# Patient Record
Sex: Female | Born: 1970 | Hispanic: No | Marital: Married | Smoking: Never smoker
Health system: Southern US, Community
[De-identification: ages and names within clinical notes are randomized; demographics above are authoritative.]

## PROBLEM LIST (undated history)

## (undated) DIAGNOSIS — N3946 Mixed incontinence: Secondary | ICD-10-CM

## (undated) DIAGNOSIS — E785 Hyperlipidemia, unspecified: Secondary | ICD-10-CM

## (undated) DIAGNOSIS — E559 Vitamin D deficiency, unspecified: Secondary | ICD-10-CM

## (undated) DIAGNOSIS — R739 Hyperglycemia, unspecified: Secondary | ICD-10-CM

## (undated) DIAGNOSIS — K589 Irritable bowel syndrome without diarrhea: Secondary | ICD-10-CM

## (undated) DIAGNOSIS — K219 Gastro-esophageal reflux disease without esophagitis: Secondary | ICD-10-CM

## (undated) DIAGNOSIS — G43909 Migraine, unspecified, not intractable, without status migrainosus: Secondary | ICD-10-CM

## (undated) HISTORY — DX: Irritable bowel syndrome, unspecified: K58.9

## (undated) HISTORY — DX: Gastro-esophageal reflux disease without esophagitis: K21.9

## (undated) HISTORY — DX: Mixed incontinence: N39.46

## (undated) HISTORY — DX: Migraine, unspecified, not intractable, without status migrainosus: G43.909

## (undated) HISTORY — DX: Vitamin D deficiency, unspecified: E55.9

## (undated) HISTORY — PX: APPENDECTOMY: SHX54

## (undated) HISTORY — DX: Hyperglycemia, unspecified: R73.9

## (undated) HISTORY — DX: Hyperlipidemia, unspecified: E78.5

---
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
## 2007-09-14 ENCOUNTER — Other Ambulatory Visit: Admission: RE | Admit: 2007-09-14 | Discharge: 2007-09-14 | Payer: Self-pay | Admitting: Gynecology

## 2008-07-18 ENCOUNTER — Ambulatory Visit: Payer: Self-pay | Admitting: Gynecology

## 2008-09-15 ENCOUNTER — Encounter: Payer: Self-pay | Admitting: Gynecology

## 2008-09-15 ENCOUNTER — Other Ambulatory Visit: Admission: RE | Admit: 2008-09-15 | Discharge: 2008-09-15 | Payer: Self-pay | Admitting: Gynecology

## 2008-09-15 ENCOUNTER — Ambulatory Visit: Payer: Self-pay | Admitting: Gynecology

## 2008-09-26 ENCOUNTER — Ambulatory Visit: Payer: Self-pay | Admitting: Gynecology

## 2009-01-02 ENCOUNTER — Ambulatory Visit: Payer: Self-pay | Admitting: Family Medicine

## 2009-01-02 DIAGNOSIS — L049 Acute lymphadenitis, unspecified: Secondary | ICD-10-CM | POA: Insufficient documentation

## 2009-01-03 ENCOUNTER — Encounter (INDEPENDENT_AMBULATORY_CARE_PROVIDER_SITE_OTHER): Payer: Self-pay | Admitting: *Deleted

## 2009-01-03 LAB — CONVERTED CEMR LAB
ALT: 16 units/L (ref 0–35)
AST: 15 units/L (ref 0–37)
Albumin: 3.7 g/dL (ref 3.5–5.2)
Alkaline Phosphatase: 66 units/L (ref 39–117)
Basophils Relative: 0 % (ref 0.0–3.0)
CO2: 29 meq/L (ref 19–32)
Calcium: 9.1 mg/dL (ref 8.4–10.5)
Eosinophils Absolute: 0.1 10*3/uL (ref 0.0–0.7)
Eosinophils Relative: 1.5 % (ref 0.0–5.0)
HDL: 39.1 mg/dL (ref >39.00)
Hemoglobin: 14 g/dL (ref 12.0–15.0)
Lymphocytes Relative: 35.5 % (ref 12.0–46.0)
MCHC: 34.2 g/dL (ref 30.0–36.0)
Monocytes Relative: 9.4 % (ref 3.0–12.0)
Neutro Abs: 3.4 10*3/uL (ref 1.4–7.7)
RBC: 4.36 M/uL (ref 3.87–5.11)
Sodium: 142 meq/L (ref 135–145)
Total CHOL/HDL Ratio: 4
Total Protein: 7 g/dL (ref 6.0–8.3)
WBC: 6.4 10*3/uL (ref 4.5–10.5)

## 2009-01-16 ENCOUNTER — Ambulatory Visit: Payer: Self-pay | Admitting: Family Medicine

## 2009-01-16 DIAGNOSIS — J309 Allergic rhinitis, unspecified: Secondary | ICD-10-CM | POA: Insufficient documentation

## 2009-01-19 ENCOUNTER — Encounter: Payer: Self-pay | Admitting: Family Medicine

## 2009-10-19 ENCOUNTER — Ambulatory Visit: Payer: Self-pay | Admitting: Family Medicine

## 2009-10-19 DIAGNOSIS — R1013 Epigastric pain: Secondary | ICD-10-CM

## 2009-10-19 DIAGNOSIS — K3189 Other diseases of stomach and duodenum: Secondary | ICD-10-CM | POA: Insufficient documentation

## 2009-10-19 DIAGNOSIS — K649 Unspecified hemorrhoids: Secondary | ICD-10-CM | POA: Insufficient documentation

## 2009-10-19 DIAGNOSIS — R109 Unspecified abdominal pain: Secondary | ICD-10-CM | POA: Insufficient documentation

## 2009-10-20 ENCOUNTER — Telehealth: Payer: Self-pay | Admitting: Family Medicine

## 2009-10-20 LAB — CONVERTED CEMR LAB
ALT: 28 units/L (ref 0–35)
AST: 19 units/L (ref 0–37)
BUN: 10 mg/dL (ref 6–23)
Basophils Absolute: 0 10*3/uL (ref 0.0–0.1)
Bilirubin, Direct: 0.1 mg/dL (ref 0.0–0.3)
Calcium: 9.1 mg/dL (ref 8.4–10.5)
Creatinine, Ser: 0.7 mg/dL (ref 0.4–1.2)
Eosinophils Relative: 1.4 % (ref 0.0–5.0)
GFR calc non Af Amer: 99.44 mL/min (ref >60)
Glucose, Bld: 109 mg/dL — ABNORMAL HIGH (ref 70–99)
H Pylori IgG: NEGATIVE
Monocytes Relative: 7.1 % (ref 3.0–12.0)
Neutrophils Relative %: 60.7 % (ref 43.0–77.0)
Platelets: 181 10*3/uL (ref 150.0–400.0)
RDW: 12.1 % (ref 11.5–14.6)
Total Bilirubin: 0.6 mg/dL (ref 0.3–1.2)
WBC: 5.4 10*3/uL (ref 4.5–10.5)

## 2009-11-06 ENCOUNTER — Ambulatory Visit: Payer: Self-pay | Admitting: Family

## 2009-11-06 ENCOUNTER — Telehealth: Payer: Self-pay | Admitting: Family

## 2009-11-06 DIAGNOSIS — Z9189 Other specified personal risk factors, not elsewhere classified: Secondary | ICD-10-CM | POA: Insufficient documentation

## 2009-11-07 ENCOUNTER — Encounter: Payer: Self-pay | Admitting: Family

## 2009-11-08 ENCOUNTER — Telehealth: Payer: Self-pay | Admitting: Family

## 2009-11-17 ENCOUNTER — Ambulatory Visit: Payer: Self-pay | Admitting: Family

## 2009-11-20 ENCOUNTER — Ambulatory Visit: Payer: Self-pay | Admitting: Gynecology

## 2009-11-20 ENCOUNTER — Other Ambulatory Visit: Admission: RE | Admit: 2009-11-20 | Discharge: 2009-11-20 | Payer: Self-pay | Admitting: Gynecology

## 2009-11-23 ENCOUNTER — Encounter: Payer: Self-pay | Admitting: Family

## 2009-12-26 ENCOUNTER — Encounter: Payer: Self-pay | Admitting: Internal Medicine

## 2010-02-06 ENCOUNTER — Encounter: Payer: Self-pay | Admitting: Family

## 2010-02-07 ENCOUNTER — Encounter: Payer: Self-pay | Admitting: Family

## 2010-05-15 ENCOUNTER — Ambulatory Visit: Payer: Self-pay | Admitting: Family Medicine

## 2010-05-15 DIAGNOSIS — R079 Chest pain, unspecified: Secondary | ICD-10-CM | POA: Insufficient documentation

## 2010-05-16 LAB — CONVERTED CEMR LAB
ALT: 16 units/L (ref 0–35)
BUN: 10 mg/dL (ref 6–23)
Basophils Relative: 0.4 % (ref 0.0–3.0)
Bilirubin, Direct: 0.1 mg/dL (ref 0.0–0.3)
Chloride: 109 meq/L (ref 96–112)
Cholesterol: 157 mg/dL (ref 0–200)
Eosinophils Relative: 1.3 % (ref 0.0–5.0)
GFR calc non Af Amer: 109.95 mL/min (ref >60)
HCT: 40.9 % (ref 36.0–46.0)
HDL: 34.4 mg/dL — ABNORMAL LOW (ref >39.00)
Hemoglobin: 14 g/dL (ref 12.0–15.0)
LDL Cholesterol: 99 mg/dL (ref 0–99)
Lymphs Abs: 1.8 10*3/uL (ref 0.7–4.0)
MCV: 95.9 fL (ref 78.0–100.0)
Monocytes Absolute: 0.4 10*3/uL (ref 0.1–1.0)
Monocytes Relative: 6.2 % (ref 3.0–12.0)
Neutro Abs: 4 10*3/uL (ref 1.4–7.7)
Potassium: 4 meq/L (ref 3.5–5.1)
RBC: 4.27 M/uL (ref 3.87–5.11)
Sodium: 142 meq/L (ref 135–145)
TSH: 0.43 microintl units/mL (ref 0.35–5.50)
Total Protein: 6.6 g/dL (ref 6.0–8.3)
VLDL: 23.2 mg/dL (ref 0.0–40.0)
WBC: 6.4 10*3/uL (ref 4.5–10.5)

## 2010-09-13 ENCOUNTER — Ambulatory Visit
Admission: RE | Admit: 2010-09-13 | Discharge: 2010-09-13 | Payer: Self-pay | Source: Home / Self Care | Attending: Family Medicine | Admitting: Family Medicine

## 2010-09-13 DIAGNOSIS — H6980 Other specified disorders of Eustachian tube, unspecified ear: Secondary | ICD-10-CM | POA: Insufficient documentation

## 2010-09-13 DIAGNOSIS — L218 Other seborrheic dermatitis: Secondary | ICD-10-CM | POA: Insufficient documentation

## 2010-10-02 NOTE — Progress Notes (Signed)
Summary: refill  Phone Note Refill Request Message from:  Fax from Pharmacy on October 20, 2009 10:27 AM  Refills Requested: Medication #1:  PROCTOSOL HC 2.5 % CREA apply two times a day - qid. can we use proctosal dc 2.5  % It is generic, but not a-b rated equivalent. Thanks   Method Requested: Fax to Local Pharmacy Next Appointment Scheduled: no appt Initial call taken by: Barb Merino,  October 20, 2009 10:29 AM  Follow-up for Phone Call        thats fine Follow-up by: Loreen Freud DO,  October 20, 2009 11:26 AM  Additional Follow-up for Phone Call Additional follow up Details #1::        phoned into pharm. Army Fossa CMA  October 20, 2009 11:42 AM     New/Updated Medications: * PROCTOSOL DC 2.5 % CREA (HYDROCORTISONE) apply two times a day - qid

## 2010-10-02 NOTE — Consult Note (Signed)
Summary: [facility]  [facility]   Imported By: Lanelle Bal 02/19/2010 13:24:28  _____________________________________________________________________  External Attachment:    Type:   Image     Comment:   External Document

## 2010-10-02 NOTE — Assessment & Plan Note (Signed)
Summary: FOR STOMACH PAIN//PH   Vital Signs:  Patient profile:   40 year old female Weight:      180 pounds BMI:     36.49 Temp:     98.6 degrees F oral Pulse rate:   72 / minute Pulse rhythm:   regular BP sitting:   122 / 80  (left arm) Cuff size:   regular  Vitals Entered By: Army Fossa CMA (October 19, 2009 1:56 PM) CC: Pt c/o upper stomach pain since sunday- no NVD. , Heartburn   History of Present Illness:  Heartburn      This is a 40 year old woman who presents with Heartburn.  The symptoms began 5 days ago.  The patient complains of sour taste in mouth and epigastric pain, but denies acid reflux, chest pain, trouble swallowing, weight loss, and weight gain.  The patient denies the following alarm features: melena, dysphagia, hematemesis, vomiting, involuntary weight loss >5%, and history of anemia.  Symptoms are worse with spicy foods and citrus.  Treatment that was tried and either found to be ineffective or stopped due to problems include an antacid.    Current Medications (verified): 1)  Fluticasone Propionate 50 Mcg/act  Susp (Fluticasone Propionate) .... 2 Sprays Each Nostril Once Daily 2)  Protonix 40 Mg Tbec (Pantoprazole Sodium) .Marland Kitchen.. 1 By Mouth Once Daily 3)  Proctosol Hc 2.5 % Crea (Hydrocortisone) .... Apply Two Times A Day - Qid  Allergies (verified): No Known Drug Allergies  Past History:  Past medical, surgical, family and social histories (including risk factors) reviewed for relevance to current acute and chronic problems.  Past Medical History: Reviewed history from 01/02/2009 and no changes required. none  Past Surgical History: Reviewed history from 01/02/2009 and no changes required. Appendectomy  Family History: Reviewed history from 01/02/2009 and no changes required. CAD-mother HTN-mother DM-mother STOKE-no COLON CA-no BREAST CA-no  Social History: Reviewed history from 01/02/2009 and no changes required. married, 2 children-  boys (99, 07) works at [employer]  Review of Systems      See HPI  Physical Exam  General:  Well-developed,well-nourished,in no acute distress; alert,appropriate and cooperative throughout examination Neck:  No deformities, masses, or tenderness noted. Lungs:  Normal respiratory effort, chest expands symmetrically. Lungs are clear to auscultation, no crackles or wheezes. Heart:  Normal rate and regular rhythm. S1 and S2 normal without gallop, murmur, click, rub or other extra sounds. Abdomen:  soft, normal bowel sounds, no distention, no masses, no guarding, no rigidity, and epigastric tenderness.   Skin:  Intact without suspicious lesions or rashes Psych:  Oriented X3 and normally interactive.     Impression & Recommendations:  Problem # 1:  DYSPEPSIA (ICD-536.8) protonix once daily check labs  GI if no better Orders: Venipuncture ([address]109) TLB-BMP (Basic Metabolic Panel-BMET) (80048-METABOL) TLB-CBC Platelet - w/Differential (85025-CBCD) TLB-Hepatic/Liver Function Pnl (80076-HEPATIC) TLB-H. Pylori Abs(Helicobacter Pylori) (86677-HELICO)  Problem # 2:  EXTERNAL HEMORRHOIDS WITHOUT MENTION COMP (ICD-455.3)  Complete Medication List: 1)  Fluticasone Propionate 50 Mcg/act Susp (Fluticasone propionate) .... 2 sprays each nostril once daily 2)  Protonix 40 Mg Tbec (Pantoprazole sodium) .Marland Kitchen.. 1 by mouth once daily 3)  Proctosol Hc 2.5 % Crea (Hydrocortisone) .... Apply two times a day - qid Prescriptions: PROCTOSOL HC 2.5 % CREA (HYDROCORTISONE) apply two times a day - qid  #1 tube x 1   Entered and Authorized by:   Loreen Freud DO   Signed by:   Loreen Freud DO on 10/19/2009  Method used:   Electronically to        [facility] # [phone]* (retail)       [address]       [place], [place]  [address]       Ph: [phone]       Fax: [phone]   RxID:   [id number] PROTONIX 40 MG TBEC (PANTOPRAZOLE SODIUM) 1 by mouth once daily  #30 x  11   Entered and Authorized by:   Loreen Freud DO   Signed by:   Loreen Freud DO on 10/19/2009   Method used:   Electronically to        [facility] # [phone]* (retail)       [address]       [place], [place]  [address]       Ph: [phone]       Fax: [phone]   RxID:   [id number] DEXILANT 60 MG CPDR (DEXLANSOPRAZOLE) 1 by mouth once daily  #30 x 5   Entered and Authorized by:   Loreen Freud DO   Signed by:   Loreen Freud DO on 10/19/2009   Method used:   Print then Give to Patient   RxID:   [id number]

## 2010-10-02 NOTE — Progress Notes (Signed)
  Phone Note Outgoing Call   Call placed to: Insurer Summary of Call: Marchia Bond, spoke with MD approval obtained- # [id number]. Initial call taken by: Lemont Fillers FNP,  November 06, 2009 5:00 PM

## 2010-10-02 NOTE — Assessment & Plan Note (Signed)
Summary: hemorroids/kdc   Vital Signs:  Patient profile:   40 year old female Weight:      177.25 pounds BMI:     35.93 Temp:     98.0 degrees F oral Pulse rate:   92 / minute Pulse rhythm:   regular Resp:     16 per minute BP sitting:   120 / 88  (right arm) Cuff size:   regular  Vitals Entered By: Mervin Kung CMA (November 06, 2009 3:08 PM) CC: room 5  Bowel movements hard, hemorrhoid x 2 weeks.   Primary Care Provider:  Beverely Low  CC:  room 5  Bowel movements hard and hemorrhoid x 2 weeks.Marland Kitchen  History of Present Illness: Destiny Rios is a 40 yr old female who presents with complaint of hemorrhoids.  Notes that she been experiencing hard stools x 3 weeks.  She started miralax two weeks ago.  Now having 3-4 BM's a day but notes that she still has to strain with BM's.  Has associated rectal pain.  Notes that she is unable to have a BM lately unless she uses miralax.  Denies fever- notes some epigastric discomfort which is intermittent.  Notes that the abdominal discomfort is worse if she is not able to move her bowels.  Denies black stools (+ brown color).  Denies any bright red blood on tissue or in the toilet.    Allergies (verified): No Known Drug Allergies  Past History:  Past Medical History: Last updated: 01/02/2009 none  Past Surgical History: Last updated: 01/02/2009 Appendectomy  Family History: Last updated: 01/02/2009 CAD-mother HTN-mother DM-mother STOKE-no COLON CA-no BREAST CA-no  Social History: Last updated: 01/02/2009 married, 2 children- boys (99, 07) works at [employer]  Risk Factors: Alcohol Use: 0 (01/02/2009) Exercise: yes (01/02/2009)  Risk Factors: Smoking Status: never (01/02/2009)  Physical Exam  General:  Well-developed,well-nourished,in no acute distress; alert,appropriate and cooperative throughout examination Head:  Normocephalic and atraumatic without obvious abnormalities. No apparent alopecia or balding. Lungs:  Normal  respiratory effort, chest expands symmetrically. Lungs are clear to auscultation, no crackles or wheezes. Heart:  Normal rate and regular rhythm. S1 and S2 normal without gallop, murmur, click, rub or other extra sounds. Abdomen:  Soft, + bowel sounds.  No distension.  + LLQ tenderness to palpation without guarding.    Impression & Recommendations:  Problem # 1:  ABDOMINAL PAIN, LEFT LOWER QUADRANT, HX OF (ICD-V15.89) Patient has LLQ tenderness and 3 week history of constipation-  Will check a CT abd/pelvis to r/o diverticulitis/colitis.  Will also plan empiric treatment with cipro and flagyl.  Pt was instructed to use back up birth control.  25 minutes were spent, greater than 50% of this time was spent on coordination of care (calling insurer) and counselling regarding abdominal pain and red flags that should prompt return.   Orders: Misc. Referral (Misc. Ref)  Complete Medication List: 1)  Fluticasone Propionate 50 Mcg/act Susp (Fluticasone propionate) .... 2 sprays each nostril once daily 2)  Protonix 40 Mg Tbec (Pantoprazole sodium) .Marland Kitchen.. 1 by mouth once daily 3)  Proctosol Dc 2.5 % Crea (hydrocortisone)  .... Apply two times a day - qid 4)  Lutera 0.1-20 Mg-mcg Tabs (Levonorgestrel-ethinyl estrad) .... Take 1 tablet by mouth once a day 5)  Cipro 500 Mg Tabs (Ciprofloxacin hcl) .... One tablet by mouth two times a day x 10 days 6)  Metronidazole 500 Mg Tabs (Metronidazole) .... One tablet by mouth three times a day x 10 days  Patient Instructions: 1)  Please complete your CT- we will call you with the results. 2)  Call if fever over 101, or worsening abdominal pain. Prescriptions: METRONIDAZOLE 500 MG TABS (METRONIDAZOLE) one tablet by mouth three times a day x 10 days  #30 x 0   Entered and Authorized by:   Lemont Fillers FNP   Signed by:   Lemont Fillers FNP on 11/06/2009   Method used:   Print then Give to Patient   RxID:   [id number] CIPRO 500 MG TABS  (CIPROFLOXACIN HCL) one tablet by mouth two times a day x 10 days  #20 x 0   Entered and Authorized by:   Lemont Fillers FNP   Signed by:   Lemont Fillers FNP on 11/06/2009   Method used:   Print then Give to Patient   RxID:   [id number]   Current Allergies (reviewed today): No known allergies

## 2010-10-02 NOTE — Consult Note (Signed)
Summary: [facility]  [facility]   Imported By: Lanelle Bal 12/11/2009 12:27:32  _____________________________________________________________________  External Attachment:    Type:   Image     Comment:   External Document

## 2010-10-02 NOTE — Consult Note (Signed)
Summary: [facility]  [facility]   Imported By: Lanelle Bal 01/02/2010 13:37:52  _____________________________________________________________________  External Attachment:    Type:   Image     Comment:   External Document

## 2010-10-02 NOTE — Letter (Signed)
Summary: Letter to Patient Regarding Xray Results/[facility]  Letter to Patient Regarding Xray Results/[facility]   Imported By: Lanelle Bal 12/11/2009 12:28:46  _____________________________________________________________________  External Attachment:    Type:   Image     Comment:   External Document

## 2010-10-02 NOTE — Progress Notes (Signed)
  Phone Note Outgoing Call   Summary of Call: Pls call patient and let her know that her CT looks normal.  I would like for her to follow up in 1 week, sooner if symptoms worsen, or if fever over 101. Initial call taken by: Lemont Fillers FNP,  November 08, 2009 1:16 PM  Follow-up for Phone Call        I informed pt. that her CT was normal and informed her of Fabian Coca's instructions. Also made pt. appt. for a f/u with Aliya Sol 11/17/09 at 3:45 Follow-up by: Michaelle Copas,  November 08, 2009 4:02 PM

## 2010-10-02 NOTE — Assessment & Plan Note (Signed)
Summary: 1 week f/u - jr   Vital Signs:  Patient profile:   40 year old female Weight:      176.50 pounds BMI:     35.78 Temp:     97.7 degrees F oral Pulse rate:   80 / minute Pulse rhythm:   regular Resp:     12 per minute BP sitting:   116 / 80  (right arm) Cuff size:   large  Vitals Entered By: Destiny Rios CMA (November 17, 2009 4:04 PM) CC: room 5  1 week f/u   Primary Care Provider:  Beverely Low  CC:  room 5  1 week f/u.  History of Present Illness: Destiny Rios is a 40 year old female who presents today for follow up of her LLQ abdominal pain.  She had a CT of the abdomen and pelvis performed which was unremarkable and has completed a 10 day course of cipro/flagyl.  Now having 4 bowel movents/day, had diarrhea last thursday and friday and now having constipation which is aggravating her hemorrhoids.  Notes that she saw one drop of blood in toilet last week due to hemorrhoids.  Bowel movements are greenish in color, denies melena.  Denies vomitting but has has had some nausea which she attributes to abx (flagyl).  Felt feverish last Saturday, but wasn't able to take her temperature.    Allergies (verified): No Known Drug Allergies  Physical Exam  General:  Well-developed,well-nourished,in no acute distress; alert,appropriate and cooperative throughout examination Head:  Normocephalic and atraumatic without obvious abnormalities. No apparent alopecia or balding. Abdomen:  soft, + bowel sounds.  Non-distended.  mild tenderness to palpation in right lower quadrant and left lower quadrant without guarding.  + RLQ scarring from former appendectomy Rectal:  No external hemorrhoids, + palpable internal hemorrhoids.   Impression & Recommendations:  Problem # 1:  ABDOMINAL PAIN, LEFT LOWER QUADRANT, HX OF (ICD-V15.89) Assessment Unchanged Will plan referral to GI at this point.  Patient may benefit from colonoscopy- will defer to GI.  Pt instructed per below  instructions. Orders: Gastroenterology Referral (GI)  Problem # 2:  HEMORRHOIDS (ICD-455.6) Continue proctosol.    Complete Medication List: 1)  Fluticasone Propionate 50 Mcg/act Susp (Fluticasone propionate) .... 2 sprays each nostril once daily 2)  Proctosol Dc 2.5 % Crea (hydrocortisone)  .... Apply two times a day - qid 3)  Lutera 0.1-20 Mg-mcg Tabs (Levonorgestrel-ethinyl estrad) .... Take 1 tablet by mouth once a day  Patient Instructions: 1)  Go to ER if you develop worsening abdominal pain, fever over 101,  or bright red blood in stool.   2)  You will be called about your referral to gastroenterology. 3)  Eat a high fiber diet and drink at least 8 glasses of water a day.  Current Allergies (reviewed today): No known allergies

## 2010-10-02 NOTE — Assessment & Plan Note (Signed)
Summary: cpx/cbs   Vital Signs:  Patient profile:   40 year old female Height:      59.25 inches Weight:      175 pounds Temp:     97.8 degrees F oral Pulse rate:   80 / minute Resp:     20 per minute BP sitting:   122 / 88  (left arm)  Vitals Entered By: Jeremy Johann CMA (May 15, 2010 8:56 AM) CC: CPX, FASTING, NO PAP   History of Present Illness: 40 yo woman here today for CPE.    1) Chest pain- L sided, 'it's tight'.  has occurred 2-3x this month.  pain will last 30 minutes.  no radiation.  no SOB, N/V, diaphoresis.  pain occurs when pt is nervous or angry.  pain has improved since starting protonix.  Preventive Screening-Counseling & Management  Alcohol-Tobacco     Alcohol drinks/day: 0     Smoking Status: never  Caffeine-Diet-Exercise     Does Patient Exercise: yes     Type of exercise: walking     Times/week: 3      Sexual History:  currently monogamous.        Drug Use:  never.    Current Medications (verified): 1)  Fluticasone Propionate 50 Mcg/act  Susp (Fluticasone Propionate) .... 2 Sprays Each Nostril Once Daily 2)  Proctosol Dc 2.5 % Crea (Hydrocortisone) .... Apply Two Times A Day - Qid 3)  Lutera 0.1-20 Mg-Mcg Tabs (Levonorgestrel-Ethinyl Estrad) .... Take 1 Tablet By Mouth Once A Day 4)  Hyoscyamine Sulfate Cr 0.375 Mg Xr12h-Tab (Hyoscyamine Sulfate) .... Take 1 Tab Once Daily Two Times A Day 5)  Protonix 40 Mg Tbec (Pantoprazole Sodium) .... Once Daily (? Dosage)  Allergies (verified): No Known Drug Allergies  Past History:  Past Surgical History: Last updated: 01/02/2009 Appendectomy  Family History: Last updated: 01/02/2009 CAD-mother HTN-mother DM-mother STOKE-no COLON CA-no BREAST CA-no  Social History: Last updated: 01/02/2009 married, 2 children- boys (99, 07) works at [employer]  Past Medical History: GERD seasonal allergies  Social History: Reviewed history from 01/02/2009 and no changes  required. married, 2 children- boys (99, 07) works at [employer] History:  currently monogamous  Review of Systems       The patient complains of chest pain and enlarged lymph nodes.  The patient denies anorexia, fever, weight loss, weight gain, vision loss, decreased hearing, hoarseness, syncope, dyspnea on exertion, prolonged cough, headaches, abdominal pain, melena, hematochezia, severe indigestion/heartburn, hematuria, suspicious skin lesions, depression, abnormal bleeding, and breast masses.         LN behind R ear- has been bx'd, benign  Physical Exam  General:  Well-developed,well-nourished,in no acute distress; alert,appropriate and cooperative throughout examination Head:  Normocephalic and atraumatic without obvious abnormalities. No apparent alopecia or balding. Eyes:  No corneal or conjunctival inflammation noted. EOMI. Perrla. Funduscopic exam benign, without hemorrhages, exudates or papilledema. Vision grossly normal. Ears:  External ear exam shows no significant lesions or deformities.  Otoscopic examination reveals clear canals, tympanic membranes are intact bilaterally without bulging, retraction, inflammation or discharge. Hearing is grossly normal bilaterally. Nose:  edematous nasal turbinates Mouth:  Oral mucosa and oropharynx without lesions or exudates.  Teeth in good repair. Neck:  No deformities, masses, or tenderness noted. Breasts:  deferred to GYN Lungs:  Normal respiratory effort, chest expands symmetrically. Lungs are clear to auscultation, no crackles or wheezes. Heart:  Normal rate and regular rhythm. S1 and S2 normal without gallop, murmur, click, rub or  other extra sounds. Abdomen:  soft, NT/ND, +BS Rectal:  No external hemorrhoids, + palpable internal hemorrhoids. Genitalia:  deferred to GYN Pulses:  +2 carotid, radial, DP Extremities:  No clubbing, cyanosis, edema, or deformity noted with normal full range of motion of all joints.    Neurologic:  No cranial nerve deficits noted. Station and gait are normal. Plantar reflexes are down-going bilaterally. DTRs are symmetrical throughout. Sensory, motor and coordinative functions appear intact. Skin:  Intact without suspicious lesions or rashes Cervical Nodes:  posterior auricular node on R- not tender, unchanged in size Axillary Nodes:  No palpable lymphadenopathy Psych:  Oriented X3 and normally interactive.     Impression & Recommendations:  Problem # 1:  HEALTHY ADULT FEMALE (ICD-V70.0) Assessment Unchanged pt's PE WNL.  check labs.  UTD on gyn exams.  anticipatory guidance provided. Orders: Venipuncture (95284) TLB-Lipid Panel (80061-LIPID) TLB-Hepatic/Liver Function Pnl (80076-HEPATIC) TLB-BMP (Basic Metabolic Panel-BMET) (80048-METABOL) TLB-CBC Platelet - w/Differential (85025-CBCD) TLB-TSH (Thyroid Stimulating Hormone) (84443-TSH) T-Vitamin D (25-Hydroxy) (13244-01027) Specimen Handling (25366)  Problem # 2:  CHEST PAIN (ICD-786.50) Assessment: New pt's sxs most likely GER or anxiety- occurs when she is upset.  EKG WNL today.  no red flags on hx or PE.  reviewed signs and sxs that should prompt immediate medical attention.  Pt expresses understanding and is in agreement w/ this plan. Orders: EKG w/ Interpretation (93000) Specimen Handling (44034)  Complete Medication List: 1)  Fluticasone Propionate 50 Mcg/act Susp (Fluticasone propionate) .... 2 sprays each nostril once daily 2)  Proctosol Dc 2.5 % Crea (hydrocortisone)  .... Apply two times a day - qid 3)  Lutera 0.1-20 Mg-mcg Tabs (Levonorgestrel-ethinyl estrad) .... Take 1 tablet by mouth once a day 4)  Hyoscyamine Sulfate Cr 0.375 Mg Xr12h-tab (Hyoscyamine sulfate) .... Take 1 tab once daily two times a day 5)  Protonix 40 Mg Tbec (Pantoprazole sodium) .... Once daily (? dosage)  Patient Instructions: 1)  Follow up in 2-3 months to recheck your chest pain and hair loss 2)  We'll notify you of your  lab results and let you know if there's any reason to return sooner 3)  Your exam looks good!  Keep up the good work! 4)  Call with any questions or concerns 5)  Hang in there!! Prescriptions: FLUTICASONE PROPIONATE 50 MCG/ACT  SUSP (FLUTICASONE PROPIONATE) 2 sprays each nostril once daily  #1 x 3   Entered and Authorized by:   Neena Rhymes MD   Signed by:   Neena Rhymes MD on 05/15/2010   Method used:   Electronically to        [facility] # [phone]* (retail)       [address]       [place], [place]  [address]       Ph: [phone]       Fax: [phone]   RxID:   [id number]

## 2010-10-02 NOTE — Letter (Signed)
Summary: Letter to Patient with Lab Results/[facility]   Letter to Patient with Lab Results/[facility]   Imported By: Lanelle Bal 02/19/2010 13:26:27  _____________________________________________________________________  External Attachment:    Type:   Image     Comment:   External Document

## 2010-10-04 NOTE — Assessment & Plan Note (Signed)
Summary: Cotton in ear from Qtip x 2 weeks/kn   Vital Signs:  Patient profile:   40 year old female Weight:      186 pounds BMI:     37.39 Temp:     98.6 degrees F oral BP sitting:   130 / 80  (left arm)  Vitals Entered By: Doristine Devoid CMA (September 13, 2010 11:43 AM) CC: Qtip in ear x2 weeks and ear pain    History of Present Illness: 40 yo woman here today for ear pain.  pt reports on 12/22 she was cleaning her ears w/ Qtip and tip remained in R ear.  starting to have ear pain.  also having ear itching.  no fevers.  not using nasal steroid spray.  dandruff- has used over the counter shampoos w/out relief.  has tried the Tgel shampoo.  has flaking all year.  Allergies (verified): No Known Drug Allergies  Review of Systems      See HPI  Physical Exam  General:  Well-developed,well-nourished,in no acute distress; alert,appropriate and cooperative throughout examination Head:  Normocephalic and atraumatic without obvious abnormalities. No apparent alopecia or balding.  + flaking of scalp Ears:  TMs retracted bilaterally, no foreign body Nose:  edematous nasal turbinates Mouth:  Oral mucosa and oropharynx without lesions or exudates.  Teeth in good repair.   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  ear pain is most likely due to retracted TMs and untreated nasal allergies.   restart steroid nasal spray.  reviewed supportive care and red flags that should prompt return.  Pt expresses understanding and is in agreement w/ this plan.  Problem # 2:  DANDRUFF (ICD-690.18) Assessment: New start prescription shampoo for pt's persistant dandruff.  Complete Medication List: 1)  Fluticasone Propionate 50 Mcg/act Susp (Fluticasone propionate) .... 2 sprays each nostril once daily 2)  Proctosol Dc 2.5 % Crea (hydrocortisone)  .... Apply two times a day - qid 3)  Lutera 0.1-20 Mg-mcg Tabs  (Levonorgestrel-ethinyl estrad) .... Take 1 tablet by mouth once a day 4)  Hyoscyamine Sulfate Cr 0.375 Mg Xr12h-tab (Hyoscyamine sulfate) .... Take 1 tab once daily two times a day 5)  Protonix 40 Mg Tbec (Pantoprazole sodium) .... Once daily (? dosage) 6)  Ciclopirox 1 % Sham (Ciclopirox) .... Wash hair daily x1 week and then 3x/week.  disp 1 bottle  Patient Instructions: 1)  You do not have an ear infection (thank goodness!) 2)  Your ear itching and pain are due to allergies 3)  Restart the nasal spray- 2 sprays in each nostril daily 4)  Call with any questions or concerns 5)  Happy New Year!!! Prescriptions: CICLOPIROX 1 % SHAM (CICLOPIROX) wash hair daily x1 week and then 3x/week.  disp 1 bottle  #1 x 6   Entered and Authorized by:   Neena Rhymes MD   Signed by:   Neena Rhymes MD on 09/13/2010   Method used:   Electronically to        [facility] # [phone]* (retail)       [address]       [place], [place]  [address]       Ph: [phone]       Fax: [phone]   RxID:   [id number] FLUTICASONE PROPIONATE 50 MCG/ACT  SUSP (FLUTICASONE PROPIONATE) 2 sprays each nostril once daily  #  1 x 3   Entered and Authorized by:   Neena Rhymes MD   Signed by:   Neena Rhymes MD on 09/13/2010   Method used:   Electronically to        [facility] # [phone]* (retail)       [address]       [place], [place]  [address]       Ph: [phone]       Fax: [phone]   RxID:   [id number]    Orders Added: 1)  Est. Patient Level III [[address]]

## 2010-11-07 ENCOUNTER — Ambulatory Visit: Payer: Self-pay | Admitting: Family Medicine

## 2010-11-07 ENCOUNTER — Encounter: Payer: Self-pay | Admitting: Family Medicine

## 2010-11-07 ENCOUNTER — Ambulatory Visit (INDEPENDENT_AMBULATORY_CARE_PROVIDER_SITE_OTHER): Payer: Managed Care, Other (non HMO) | Admitting: Family Medicine

## 2010-11-07 DIAGNOSIS — G5600 Carpal tunnel syndrome, unspecified upper limb: Secondary | ICD-10-CM | POA: Insufficient documentation

## 2010-11-07 DIAGNOSIS — L600 Ingrowing nail: Secondary | ICD-10-CM

## 2010-11-20 NOTE — Assessment & Plan Note (Signed)
Summary: rt big toe--infected?///sph   Vital Signs:  Patient profile:   40 year old female Height:      59.25 inches (150.50 cm) Weight:      185.25 pounds (84.20 kg) BMI:     37.23 Temp:     98.0 degrees F (36.67 degrees C) oral BP sitting:   100 / 64  (left arm) Cuff size:   large  Vitals Entered By: Lucious Groves CMA (November 07, 2010 4:23 PM) CC: Possible infection of right great toe./kb Is Patient Diabetic? No Pain Assessment Patient in pain? no      Comments Patient denies having any pain right now.   History of Present Illness: 40 yo woman here today for ? infxn of R big toe.  will have intermittant pain.  lateral nail edge is indurated.  had drainage last week.  reports 'i've always had an ingrown toenail'  4 weeks ago son stepped on edge of nail and has had problems since.  using hydrogen peroxide regularly  carpal tunnel- R>L, sxs x2 months.  will wake up w/ pain.  occasionally travels from the wrist to elbow.  reports fingers feel 'numb and weak'.  Current Medications (verified): 1)  Fluticasone Propionate 50 Mcg/act  Susp (Fluticasone Propionate) .... 2 Sprays Each Nostril Once Daily 2)  Proctosol Dc 2.5 % Crea (Hydrocortisone) .... Apply Two Times A Day - Qid 3)  Lutera 0.1-20 Mg-Mcg Tabs (Levonorgestrel-Ethinyl Estrad) .... Take 1 Tablet By Mouth Once A Day 4)  Protonix 40 Mg Tbec (Pantoprazole Sodium) .... Once Daily (? Dosage) 5)  Augmentin 500-125 Mg Tabs (Amoxicillin-Pot Clavulanate) .Marland Kitchen.. 1 By Mouth 2 Times Daily X7 Days.  Take W/ Food.  Allergies (verified): No Known Drug Allergies  Review of Systems      See HPI  Physical Exam  General:  Well-developed,well-nourished,in no acute distress; alert,appropriate and cooperative throughout examination Msk:  + phalen's bilaterally Pulses:  +2 radial, ulnar, DP Extremities:  R great toe w/ induration and erythema along lateral nail edge, no pus   Impression & Recommendations:  Problem # 1:  INGROWN TOENAIL,  INFECTED (ICD-703.0) Assessment New start abx to tx infxn.  refer to podiatry for definitive tx w/ nail excision. Her updated medication list for this problem includes:    Augmentin 500-125 Mg Tabs (Amoxicillin-pot clavulanate) .Marland Kitchen... 1 by mouth 2 times daily x7 days.  take w/ food.  Orders: Podiatry Referral (Podiatry)  Problem # 2:  CARPAL TUNNEL SYNDROME (ICD-354.0) Assessment: New + phalen's test on exam today.  refer to ortho. Orders: Orthopedic Referral (Ortho)  Complete Medication List: 1)  Fluticasone Propionate 50 Mcg/act Susp (Fluticasone propionate) .... 2 sprays each nostril once daily 2)  Proctosol Dc 2.5 % Crea (hydrocortisone)  .... Apply two times a day - qid 3)  Lutera 0.1-20 Mg-mcg Tabs (Levonorgestrel-ethinyl estrad) .... Take 1 tablet by mouth once a day 4)  Protonix 40 Mg Tbec (Pantoprazole sodium) .... Once daily (? dosage) 5)  Augmentin 500-125 Mg Tabs (Amoxicillin-pot clavulanate) .Marland Kitchen.. 1 by mouth 2 times daily x7 days.  take w/ food.  Patient Instructions: 1)  Take the Augmentin as directed- Take w/ food to avoid upset stomach 2)  We'll call you with your podiatry appt 3)  Keep the area clean and dry 4)  Apply Fungi-Nail to your nails to treat the fungal infection 5)  Call with any questions or concerns 6)  Hang in there!!! Prescriptions: AUGMENTIN 500-125 MG TABS (AMOXICILLIN-POT CLAVULANATE) 1 by mouth 2  times daily x7 days.  take w/ food.  #14 x 0   Entered and Authorized by:   Neena Rhymes MD   Signed by:   Neena Rhymes MD on 11/07/2010   Method used:   Electronically to        [facility] # [phone]* (retail)       [address]       [place], [place]  [address]       Ph: [phone]       Fax: [phone]   RxID:   [id number]    Orders Added: 1)  Podiatry Referral [Podiatry] 2)  Orthopedic Referral [Ortho] 3)  Est. Patient Level III [25956]

## 2010-12-10 ENCOUNTER — Encounter: Payer: Self-pay | Admitting: Family Medicine

## 2010-12-19 ENCOUNTER — Encounter (INDEPENDENT_AMBULATORY_CARE_PROVIDER_SITE_OTHER): Payer: Managed Care, Other (non HMO) | Admitting: Gynecology

## 2010-12-19 DIAGNOSIS — R635 Abnormal weight gain: Secondary | ICD-10-CM

## 2010-12-19 DIAGNOSIS — Z01419 Encounter for gynecological examination (general) (routine) without abnormal findings: Secondary | ICD-10-CM

## 2010-12-20 ENCOUNTER — Encounter: Payer: Managed Care, Other (non HMO) | Admitting: Gynecology

## 2010-12-20 ENCOUNTER — Other Ambulatory Visit: Payer: Managed Care, Other (non HMO)

## 2010-12-20 ENCOUNTER — Ambulatory Visit (INDEPENDENT_AMBULATORY_CARE_PROVIDER_SITE_OTHER): Payer: Managed Care, Other (non HMO) | Admitting: Gynecology

## 2010-12-20 DIAGNOSIS — R1032 Left lower quadrant pain: Secondary | ICD-10-CM

## 2010-12-31 ENCOUNTER — Other Ambulatory Visit (INDEPENDENT_AMBULATORY_CARE_PROVIDER_SITE_OTHER): Payer: Managed Care, Other (non HMO)

## 2010-12-31 DIAGNOSIS — R7309 Other abnormal glucose: Secondary | ICD-10-CM

## 2011-01-08 ENCOUNTER — Other Ambulatory Visit (INDEPENDENT_AMBULATORY_CARE_PROVIDER_SITE_OTHER): Payer: Managed Care, Other (non HMO)

## 2011-01-08 ENCOUNTER — Other Ambulatory Visit: Payer: Managed Care, Other (non HMO)

## 2011-01-08 DIAGNOSIS — R7309 Other abnormal glucose: Secondary | ICD-10-CM

## 2011-01-08 DIAGNOSIS — R799 Abnormal finding of blood chemistry, unspecified: Secondary | ICD-10-CM

## 2011-01-17 ENCOUNTER — Encounter: Payer: Self-pay | Admitting: Family Medicine

## 2011-01-17 ENCOUNTER — Ambulatory Visit (INDEPENDENT_AMBULATORY_CARE_PROVIDER_SITE_OTHER): Payer: Managed Care, Other (non HMO) | Admitting: Family Medicine

## 2011-01-17 DIAGNOSIS — R030 Elevated blood-pressure reading, without diagnosis of hypertension: Secondary | ICD-10-CM

## 2011-01-17 DIAGNOSIS — IMO0001 Reserved for inherently not codable concepts without codable children: Secondary | ICD-10-CM | POA: Insufficient documentation

## 2011-01-17 DIAGNOSIS — R079 Chest pain, unspecified: Secondary | ICD-10-CM | POA: Insufficient documentation

## 2011-01-17 NOTE — Patient Instructions (Signed)
Please schedule a follow up in 2-4 weeks to recheck blood pressure I am not concerned about your sugars- we'll check this again in 6 months.  Just work on [employer] and regular exercise until then Your EKG is normal- this is good news!!! Call with any questions or concerns Hang in there!!

## 2011-01-17 NOTE — Progress Notes (Signed)
  Subjective:    Patient ID: Destiny Rios, female    DOB: 1971-04-06, 40 y.o.   MRN: [id number]  HPI Elevated BP- went to GYN last month and reports BP was high, #s unknown.  On Monday BP at work was 140/90.  Will occasionally have flushing or HAs for 'no reason'.  She reports some chest tightness today.  Denies SOB, nausea, abd pain, dizziness.   Review of Systems For ROS see HPI     Objective:   Physical Exam  Constitutional: She is oriented to person, place, and time. She appears well-developed and well-nourished. No distress.  HENT:  Head: Normocephalic and atraumatic.  Eyes: Conjunctivae and EOM are normal. Pupils are equal, round, and reactive to light.  Neck: Normal range of motion. Neck supple. No thyromegaly present.  Cardiovascular: Normal rate, regular rhythm, normal heart sounds and intact distal pulses.   No murmur heard. Pulmonary/Chest: Effort normal and breath sounds normal. No respiratory distress.  Abdominal: Soft. She exhibits no distension. There is no tenderness.  Musculoskeletal: She exhibits no edema.  Lymphadenopathy:    She has no cervical adenopathy.  Neurological: She is alert and oriented to person, place, and time.  Skin: Skin is warm and dry.  Psychiatric: She has a normal mood and affect. Her behavior is normal.          Assessment & Plan:

## 2011-01-18 ENCOUNTER — Other Ambulatory Visit: Payer: Managed Care, Other (non HMO)

## 2011-01-29 NOTE — Assessment & Plan Note (Signed)
BP not elevated today in office- diastolic mildly so but nothing that would require medications.  Encouraged pt to work on [employer], regular exercise and limit salt intake.  Will continue to follow closely.  Reviewed supportive care and red flags that should prompt return.  Pt expressed understanding and is in agreement w/ plan.

## 2011-01-29 NOTE — Assessment & Plan Note (Signed)
Given reported elevated BPs and chest tightness today, EKG was obtained- normal.  Most likely due to anxiety- pt has a hx of this.  Reassurance provided.  Pt felt better.

## 2011-02-14 ENCOUNTER — Ambulatory Visit: Payer: Managed Care, Other (non HMO) | Admitting: Family Medicine

## 2011-04-30 ENCOUNTER — Encounter: Payer: Self-pay | Admitting: Family Medicine

## 2011-04-30 ENCOUNTER — Ambulatory Visit (INDEPENDENT_AMBULATORY_CARE_PROVIDER_SITE_OTHER): Payer: Managed Care, Other (non HMO) | Admitting: Family Medicine

## 2011-04-30 VITALS — BP 114/74 | HR 91 | Temp 98.7°F | Wt 177.0 lb

## 2011-04-30 DIAGNOSIS — R21 Rash and other nonspecific skin eruption: Secondary | ICD-10-CM

## 2011-04-30 MED ORDER — PREDNISONE 10 MG PO TABS: 10.0000 mg | ORAL_TABLET | Freq: Every day | ORAL | Status: AC

## 2011-04-30 MED ORDER — METHYLPREDNISOLONE ACETATE 80 MG/ML IJ SUSP
80.0000 mg | Freq: Once | INTRAMUSCULAR | Status: AC
  Administered 2011-04-30: 80 mg via INTRAMUSCULAR

## 2011-04-30 MED ORDER — DOXYCYCLINE HYCLATE 100 MG PO TABS: ORAL_TABLET | ORAL | Status: AC

## 2011-04-30 NOTE — Progress Notes (Signed)
  Subjective:    Patient ID: Destiny Rios, female    DOB: 04-23-1971, 40 y.o.   MRN: [id number]  HPI Pt here c/o rash since last night.  Very itchy.  Pt was bit by something on her foot---it is very red. Pt is taking benadryl only.  No sob, fever or chest pain.   Review of Systems    as above Objective:   Physical Exam  Constitutional: She is oriented to person, place, and time. She appears well-developed and well-nourished.  Cardiovascular: Normal rate, regular rhythm and normal heart sounds.   Pulmonary/Chest: Effort normal and breath sounds normal.  Musculoskeletal: Normal range of motion. She exhibits no edema and no tenderness.  Neurological: She is alert and oriented to person, place, and time.  Skin:       + papular rash with escoriations on both arms and legs + bite mark on L foot with surrounding errythema and warm to touch          Assessment & Plan:  Insect bite--with cellulitis and rash ----   Doxy for 10 days  Check labs pred taper con't benadryl Depo medrol IM

## 2011-04-30 NOTE — Patient Instructions (Signed)
Insect Bite or Sting, Infected Cellulitis An insect bite which has become infected is called cellulitis. Cellulitis is an infection of the skin and the tissue beneath it. The area is typically red and tender. This is caused by germs (bacteria, usually staph or strep) that have entered the body through the sting or bite. This infection usually responds well to antibiotics. Antibiotics are medications which kill germs. HOME CARE INSTRUCTIONS  If you are given a prescription for medicine that kill germs (antibiotics), take as directed until finished.   If the infection is on the arm or leg, keep the limb elevated as you are able.   Keep the affected area clean and dry.   See your caregiver for recheck of the infected site in 1 week if no better, or sooner if problems arise.   Only take over-the-counter or prescription medicines for pain, discomfort, or fever as directed by your caregiver.  SEEK MEDICAL CARE IF:  An oral temperature above 100.4 develops, not controlled by medication.   Pus or a foul odor develop.   The area of redness (inflammation) is spreading, there are red streaks coming from the infected site, or if a part of the infection begins to turn dark in color.   The joint or bone underneath the infected skin becomes painful after the skin has healed.   The infection returns in the same or another area after it seems to have gone away.   New, unexplained symptoms such as pain or fever develop.  SEEK IMMEDIATE MEDICAL CARE IF: You or your child feel drowsy, lethargic, or have vomiting, diarrhea, or generalized malaise with muscle aches and pains. MAKE SURE YOU:   Understand these instructions.   Will watch your condition.   Will get help right away if you are not doing well or get worse.  Document Released: 07/17/2006 Document Re-Released: 06/16/2009 [facility] Patient Information 2011 [place], [place].

## 2011-05-01 LAB — BASIC METABOLIC PANEL
BUN: 12 mg/dL (ref 6–23)
Chloride: 106 mEq/L (ref 96–112)
Glucose, Bld: 114 mg/dL — ABNORMAL HIGH (ref 70–99)
Potassium: 4.3 mEq/L (ref 3.5–5.1)
Sodium: 140 mEq/L (ref 135–145)

## 2011-05-01 LAB — HEPATIC FUNCTION PANEL
ALT: 20 U/L (ref 0–35)
AST: 21 U/L (ref 0–37)
Albumin: 4 g/dL (ref 3.5–5.2)
Total Bilirubin: 0.4 mg/dL (ref 0.3–1.2)

## 2011-05-01 LAB — CBC WITH DIFFERENTIAL/PLATELET
Basophils Absolute: 0 10*3/uL (ref 0.0–0.1)
Eosinophils Relative: 1.4 % (ref 0.0–5.0)
HCT: 42.6 % (ref 36.0–46.0)
Hemoglobin: 14.2 g/dL (ref 12.0–15.0)
Lymphs Abs: 1.6 10*3/uL (ref 0.7–4.0)
MCV: 94.8 fl (ref 78.0–100.0)
Monocytes Absolute: 0.5 10*3/uL (ref 0.1–1.0)
Monocytes Relative: 11.6 % (ref 3.0–12.0)
Neutro Abs: 1.8 10*3/uL (ref 1.4–7.7)
Platelets: 201 10*3/uL (ref 150.0–400.0)
RDW: 12.9 % (ref 11.5–14.6)

## 2011-05-01 LAB — ROCKY MTN SPOTTED FVR ABS PNL(IGG+IGM)
RMSF IgG: 0.11 IV
RMSF IgM: 0.12 IV

## 2011-10-01 ENCOUNTER — Encounter: Payer: Self-pay | Admitting: Family Medicine

## 2011-10-01 ENCOUNTER — Ambulatory Visit (INDEPENDENT_AMBULATORY_CARE_PROVIDER_SITE_OTHER): Payer: Managed Care, Other (non HMO) | Admitting: Family Medicine

## 2011-10-01 DIAGNOSIS — M62838 Other muscle spasm: Secondary | ICD-10-CM

## 2011-10-01 DIAGNOSIS — L819 Disorder of pigmentation, unspecified: Secondary | ICD-10-CM

## 2011-10-01 MED ORDER — NAPROXEN 500 MG PO TABS: 500.0000 mg | ORAL_TABLET | Freq: Two times a day (BID) | ORAL | Status: AC

## 2011-10-01 MED ORDER — CYCLOBENZAPRINE HCL 10 MG PO TABS: 10.0000 mg | ORAL_TABLET | Freq: Three times a day (TID) | ORAL | Status: DC | PRN

## 2011-10-01 NOTE — Assessment & Plan Note (Signed)
New.  Pt's pain is not true shoulder pain but rather L trap spasm causing radiating pain into shoulder and arm.  This is likely stress related due to mom's recent passing.  Start scheduled NSAIDs and muscle relaxers at night.  Heating pad prn.  Reviewed supportive care and red flags that should prompt return.  Pt expressed understanding and is in agreement w/ plan.

## 2011-10-01 NOTE — Patient Instructions (Signed)
This is all due to your tight muscle spasm Start the Flexeril at night for spasm relief (during the day as needed but it will make you sleepy) Take the Naproxen twice daily x5-7 days- w/ food- for the inflammation Use a heating pad The skin changes are nothing to worry about.  They are not bruises and you may notice more as you get older Call with any questions or concerns I'm so sorry for your loss

## 2011-10-01 NOTE — Progress Notes (Signed)
  Subjective:    Patient ID: Destiny Rios, female    DOB: [birth year], 41 y.o.   MRN: [id number]  HPI Shoulder pain- L sided.  sxs started 10 days ago.  sxs are intermittent.  Pt is R hand dominant.  Pain described as a burning pain.  Will radiate down into arm and up into neck.  Nothing makes pain worse.  Nothing improves pain- ibuprofen w/out relief.  No hx of similar.  Pain started after receiving news that mom was near death.  No weakness or numbness of arm.  No known injury or change in activity.  Hyperpigmented areas- notes on upper back/neck.  Present since Nov.  'look like bruises'.  No pain, no itching.  Has not changed in size or shape.    Review of Systems For ROS see HPI     Objective:   Physical Exam  Vitals reviewed. Constitutional: She appears well-developed and well-nourished. No distress.  Neck: Normal range of motion. Neck supple.       Bilateral trap spasm  Musculoskeletal:       Normal ROM of L shoulder- forward flexion, abduction, internal/external rotation No TTP over clavicle, AC joint, glenohumeral joint  Neurological: She has normal reflexes. No cranial nerve deficit. Coordination normal.       Strength and sensation intact in UEs bilaterally  Skin: Skin is warm and dry. No rash noted. No erythema.       Small hyperpigmented areas (2) on R upper back w/out concerning features          Assessment & Plan:

## 2011-10-01 NOTE — Assessment & Plan Note (Signed)
New.  No abnormalities noted.  Discussed that this is likely genetic and a consequence of aging.  Pt reassured.

## 2011-11-12 ENCOUNTER — Ambulatory Visit (INDEPENDENT_AMBULATORY_CARE_PROVIDER_SITE_OTHER): Payer: Managed Care, Other (non HMO) | Admitting: Family Medicine

## 2011-11-12 ENCOUNTER — Encounter: Payer: Self-pay | Admitting: Family Medicine

## 2011-11-12 DIAGNOSIS — Z0000 Encounter for general adult medical examination without abnormal findings: Secondary | ICD-10-CM | POA: Insufficient documentation

## 2011-11-12 LAB — CBC WITH DIFFERENTIAL/PLATELET
Basophils Relative: 0.3 % (ref 0.0–3.0)
Eosinophils Absolute: 0.1 10*3/uL (ref 0.0–0.7)
Eosinophils Relative: 1.6 % (ref 0.0–5.0)
HCT: 40.3 % (ref 36.0–46.0)
Lymphocytes Relative: 30.3 % (ref 12.0–46.0)
MCHC: 33.3 g/dL (ref 30.0–36.0)
MCV: 94.3 fl (ref 78.0–100.0)
Monocytes Relative: 7.4 % (ref 3.0–12.0)
Neutro Abs: 4.9 10*3/uL (ref 1.4–7.7)
Platelets: 184 10*3/uL (ref 150.0–400.0)
RDW: 12.9 % (ref 11.5–14.6)

## 2011-11-12 LAB — HEPATIC FUNCTION PANEL
ALT: 25 U/L (ref 0–35)
AST: 17 U/L (ref 0–37)
Total Bilirubin: 0.4 mg/dL (ref 0.3–1.2)
Total Protein: 6.5 g/dL (ref 6.0–8.3)

## 2011-11-12 LAB — BASIC METABOLIC PANEL
BUN: 14 mg/dL (ref 6–23)
CO2: 27 mEq/L (ref 19–32)
Chloride: 104 mEq/L (ref 96–112)
Glucose, Bld: 84 mg/dL (ref 70–99)
Potassium: 3.5 mEq/L (ref 3.5–5.1)

## 2011-11-12 LAB — LIPID PANEL: VLDL: 25 mg/dL (ref 0.0–40.0)

## 2011-11-12 LAB — TSH: TSH: 0.51 u[IU]/mL (ref 0.35–5.50)

## 2011-11-12 MED ORDER — PANTOPRAZOLE SODIUM 40 MG PO TBEC: 40.0000 mg | DELAYED_RELEASE_TABLET | Freq: Every day | ORAL | Status: AC

## 2011-11-12 MED ORDER — FLUTICASONE PROPIONATE 50 MCG/ACT NA SUSP: 2.0000 | Freq: Every day | NASAL | Status: DC

## 2011-11-12 MED ORDER — HYOSCYAMINE SULFATE 0.125 MG PO TABS: 0.1250 mg | ORAL_TABLET | ORAL | Status: AC | PRN

## 2011-11-12 NOTE — Assessment & Plan Note (Signed)
Pt's PE WNL w/ exception of obesity.  Check labs to risk stratify.  UTD on GYN.  Anticipatory guidance provided.

## 2011-11-12 NOTE — Patient Instructions (Signed)
You look great!  Keep up the good work! We'll notify you of your lab results Call with any questions or concerns Try and get regular exercise Happy Spring!!!

## 2011-11-12 NOTE — Progress Notes (Signed)
  Subjective:    Patient ID: Destiny Rios, female    DOB: [birth year], 41 y.o.   MRN: [id number]  HPI CPE- UTD on GYN (Dr Lily Peer).  No concerns today.  Review of Systems Patient reports no vision/ hearing changes, adenopathy,fever, weight change,  persistant/recurrent hoarseness , swallowing issues, chest pain, palpitations, edema, persistant/recurrent cough, hemoptysis, dyspnea (rest/exertional/paroxysmal nocturnal), gastrointestinal bleeding (melena, rectal bleeding), abdominal pain, significant heartburn, bowel changes, GU symptoms (dysuria, hematuria, incontinence), Gyn symptoms (abnormal  bleeding, pain),  syncope, focal weakness, memory loss, numbness & tingling, skin/hair/nail changes, abnormal bruising or bleeding, anxiety, or depression.     Objective:   Physical Exam General Appearance:    Alert, cooperative, no distress, appears stated age  Head:    Normocephalic, without obvious abnormality, atraumatic  Eyes:    PERRL, conjunctiva/corneas clear, EOM's intact, fundi    benign, both eyes  Ears:    Normal TM's and external ear canals, both ears  Nose:   Nares normal, septum midline, mucosa normal, no drainage    or sinus tenderness  Throat:   Lips, mucosa, and tongue normal; teeth and gums normal  Neck:   Supple, symmetrical, trachea midline, no adenopathy;    Thyroid: no enlargement/tenderness/nodules  Back:     Symmetric, no curvature, ROM normal, no CVA tenderness  Lungs:     Clear to auscultation bilaterally, respirations unlabored  Chest Wall:    No tenderness or deformity   Heart:    Regular rate and rhythm, S1 and S2 normal, no murmur, rub   or gallop  Breast Exam:    Deferred to GYN  Abdomen:     Soft, non-tender, bowel sounds active all four quadrants,    no masses, no organomegaly  Genitalia:    Deferred to GYN  Rectal:    Extremities:   Extremities normal, atraumatic, no cyanosis or edema  Pulses:   2+ and symmetric all extremities  Skin:   Skin color,  texture, turgor normal, no rashes or lesions  Lymph nodes:   Cervical, supraclavicular, and axillary nodes normal  Neurologic:   CNII-XII intact, normal strength, sensation and reflexes    throughout          Assessment & Plan:

## 2011-11-13 ENCOUNTER — Encounter: Payer: Self-pay | Admitting: *Deleted

## 2011-11-15 ENCOUNTER — Encounter: Payer: Self-pay | Admitting: *Deleted

## 2011-11-15 LAB — VITAMIN D 1,25 DIHYDROXY
Vitamin D 1, 25 (OH)2 Total: 73 pg/mL — ABNORMAL HIGH (ref 18–72)
Vitamin D2 1, 25 (OH)2: <8 pg/mL
Vitamin D3 1, 25 (OH)2: 73 pg/mL

## 2012-02-14 ENCOUNTER — Ambulatory Visit (INDEPENDENT_AMBULATORY_CARE_PROVIDER_SITE_OTHER): Payer: BC Managed Care – PPO | Admitting: Gynecology

## 2012-02-14 ENCOUNTER — Encounter: Payer: Self-pay | Admitting: Gynecology

## 2012-02-14 VITALS — BP 128/82 | Ht 58.75 in | Wt 177.0 lb

## 2012-02-14 DIAGNOSIS — Z01419 Encounter for gynecological examination (general) (routine) without abnormal findings: Secondary | ICD-10-CM

## 2012-02-14 MED ORDER — LEVONORGESTREL-ETHINYL ESTRAD 0.1-20 MG-MCG PO TABS: 1.0000 | ORAL_TABLET | Freq: Every day | ORAL | Status: DC

## 2012-02-14 NOTE — Patient Instructions (Addendum)
Mantenimiento de la salud en las mujeres (Health Maintenance, Females) Un estilo de vida saludable y los cuidados preventivos pueden favorecer la salud y el bienestar.   Haga exmenes regulares de la salud en general, dentales y de los ojos.   Consuma una dieta saludable. Los alimentos como vegetales, frutas, granos enteros, productos lcteos descremados y protenas magras contienen los nutrientes que usted necesita sin necesidad de consumir muchas caloras. Disminuya el consumo de alimentos con alto contenido de grasas slidas, azcar y sal agregadas. Si es necesario, pdaleinformacin acerca de una dieta adecuada a su mdico.   La actividad fsica regular es una de las cosas ms importantes que puede hacer por su salud. Los adultos deben hacer al menos 150 minutos de ejercicios de intensidad moderada (cualquier actividad que aumente la frecuencia cardaca y lo haga transpirar) cada semana. Adems, la mayora de los adultos necesita ejercicios de fortalecimiento muscular 2  ms das por semana.    Mantenga un peso saludable. El ndice de masa corporal (IMC) es una herramienta que identifica posibles problemas con el peso. Proporciona una estimacin de la grasa corporal basndose en el peso y la altura. El mdico podr determinar su IMC y podr ayudarlo a lograr o mantener un peso saludable. Para los adultos de 20 aos o ms:   Un IMC menor a 18,5 se considera bajo peso.   Un IMC entre 18,5 y 24,9 es normal.   Un IMC entre 25 y 29,9 es sobrepeso.   Un IMC entre 30 o ms es obesidad.   Mantenga un nivel normal de lpidos y colesterol en sangre practicando actividad fsica y minimizando la ingesta de grasas saturadas. Consuma una dieta balanceada e incluya variedad de frutas y vegetales. Los anlisis de lpidos y colesterol en sangre deben comenzar a los 20 aos y repetirse cada 5 aos. Si los niveles de colesterol son altos, tiene ms de 50 aos o tiene riesgo elevado de sufrir enfermedades  cardacas, necesitar controlarse con ms frecuencia.Si tiene niveles elevados de lpidos y colesterol, debe recibir tratamiento con medicamentos, si la dieta y el ejercicio no son efectivos.   Si fuma, consulte con el profesional acerca de las opciones para dejar de hacerlo. Si no lo hace, no comience.   Si est embarazada no beba alcohol. Si est amamantando, beba alcohol con prudencia. Si elige beber alcohol, no se exceda de 1 medida por da. Se considera una medida a 12 onzas (355 ml) de cerveza, 5 onzas (148 ml) de vino, o 1,5 onzas (44 ml) de licor.   Evite el alcohol y el consumo de drogas. No comparta agujas. Pida ayuda si necesita asistencia o instrucciones con respecto a abandonar el consumo de alcohol, cigarrillos o drogas.   La hipertensin arterial causa enfermedades cardacas y aumenta el riesgo de ictus. Debe controlar su presin arterial al menos cada 1 o 2 aos. La presin arterial elevada que persiste debe tratarse con medicamentos si la prdida de peso y el ejercicio no son efectivos.   Si tiene entre 55 y 79 aos, consulte a su mdico si debe tomar aspirina para prevenir enfermedades cardacas.   Los anlisis para la diabetes incluyen la toma de una muestra de sangre para controlar el nivel de azcar en la sangre durante el ayuno. Debe hacerlo cada 3 aos despus de los 45 aos si est dentro de su peso normal y sin factores de riesgo para la diabetes. Las pruebas deben comenzar a edades tempranas o llevarse a cabo con ms frecuencia   si tiene sobrepeso y al menos 1 factor de riesgo para la diabetes.   Las evaluaciones para detectar el cncer de mama son un mtodo preventivo fundamental para las mujeres. Debe practicar la "autoconciencia de las mamas". Esto significa que debe reconocer la apariencia normal de sus mamas y como las siente y pudiendo incluir un autoexamen de mamas. Si detecta algn cambio, no importa cun pequeo sea, debe informarlo a su mdico. Las mujeres entre 20 y  40 aos deben hacer un examen clnico de las mamas como parte del examen regular de salud, cada 1 a 3 aos. Despus de los 40 aos deben hacerlo todos los aos. Deben hacerse una mamografa radografa de mamas ) cada ao, comenzando a los 40 aos. Las mujeres con historia familiar de cncer de mama deben hablar con el mdico para hacer un estudio gentico. Las que tienen ms riesgo deben hacerse resonancia magntica y una mamografa todos los aos.   Un test de Pap se realiza para diagnosticar cncer de cuello de tero. Las mujeres deben hacerse un test de Pap a partir de los 21 aos. Entre los 21 y los 29 aos debe repetirse cada dos aos. Luego de los 30 aos, debe realizarse un test de Pap cada tres aos siempre que los 3 estudios anteriores sean normales. Si le han realizado una histerectoma por un problema que no era cncer u otra enfermedad que podra causar cncer, ya no necesitar un test de Pap. Si tiene entre 65 y 70 aos y ha tenido un test de Pap normal en los ltimos 10 aos, ya no ser necesario realizarlo. Si ha recibido un tratamiento para el cncer cervical o para una enfermedad que podra causar cncer, necesitar realizar un test de Pap y controles durante al menos 20 aos de concluir el tratamiento. Si no se ha hecho el examen con regularidad, debern volver a evaluarse los factores de riesgo (como el tener un nuevo compaero sexual) para determinar si debe volver a realizarse los estudios. Algunas mujeres sufren problemas mdicos que aumentan la probabilidad de contraer cncer cervical. En estos casos, el mdico podr indicar que se realice el test de Pap con ms frecuencia.   La prueba del virus del papiloma humano (VPH) es un anlisis adicional que puede usarse para detectar cncer de cuello de tero. Esta prueba busca la presencia del virus que causa los cambios en el cuello. Las clulas que se recolectan durante el test de Pap pueden usarse para el VPH. La prueba para el VPH puede  usarse para evaluar a mujeres de ms de 30 aos y debe usarse en mujeres de cualquier edad cuyos resultados del test de Pap no sean claros. Despus de los 30 aos, las mujeres deben hacerse el anlisis para el VPH con la misma frecuencia que el test de Pap.   El cncer colorectal puede detectarse y con frecuencia puede prevenirse. La mayor parte de los estudios de rutina comienzan a los 50 aos y continan hasta los 75 aos. Sin embargo, el mdico podr aconsejarle que lo haga antes, si tiene factores de riesgo para el cncer de colon. Una vez por ao, el profesional le dar un kit de prueba para hallar sangre oculta en la materia fecal. La utilizacin de un tubo con una pequea cmara en su extremo para examinar directamente el colon (sigmoidoscopa o colonoscopa), puede detectar formas temprana de cncer colorectal. Hable con su mdico si tiene 50 aos, cuando comience con los estudios de rutina. El examen directo del   colon debe repetirse cada 5 a 10 aos, hasta los 75 aos, excepto que se encuentren formas tempranas de plipos precancerosos o pequeos bultos.   Se recomienda realizar un anlisis de sangre para Engineer, manufacturing hepatitis C a todas las personas [address] 1945 y 1965, y a todo aquel que tenga un riesgo conocido de haber contrado esta enfermedad.   Practique el sexo seguro. Use condones y evite las prcticas sexuales riesgosas para disminuir el contagio de enfermedades de transmisin sexual. Las mujeres sexualmente activas de 25 aos o menos deben controlarse para descartar clamidia, que es una infeccin de transmisin sexual frecuente. Las [employer] que tengan mltiples compaeros tambin deben hacerse el anlisis para Engineer, manufacturing clamidia. Se recomienda realizar anlisis para detectar otras enfermedades de transmisin sexual si es sexualmente [place] y tiene riesgos.   La osteoporosis es una enfermedad en la que los huesos pierden los minerales y la fuerza por el avance de la edad. El  resultado pueden ser fracturas graves en los [place]. El riesgo de osteoporosis puede identificarse con Neomia Dear prueba de densidad sea. Las mujeres de ms de 65 aos y las que tengan riesgos de sufrir fracturas u osteoporosis deben pedir consejo a su mdico. Consulte a su mdico si debe tomar un suplemento de calcio o de vitamina D para reducir el riesgo de osteoporosis.   La menopausia se asocia a sntomas y riesgos fsicos. Se dispone de una terapia de reemplazo hormonal para disminuir los sntomas y [place]. Consulte a su mdico para saber si la terapia de reemplazo hormonal es conveniente para usted.   Use una pantalla solar con un factor SPF de 30 o mayor. Aplique pantalla de Pietro Cassis y repetida a lo largo del Futures trader. Pngase al resguardo del sol cuando la sombra sea ms pequea que usted. Protjase usando mangas y [employer], un sombrero de ala ancha y gafas para el sol todo el ao, siempre que se encuentre en el exterior.   Informe a su mdico si aparecen nuevos lunares o los que tiene se modifican, especialmente en forma y color. Tambin notifique al mdico si un lunar es ms grande que el tamao de una goma de Paramedic.   Mantngase al da con las vacunas.  Document Released: 08/08/2011 [facility] Patient Information 2012 [place], [place].  Ejercicios para perder peso (Exercise to Lose Weight) La actividad fsica y Neomia Dear dieta saludable ayudan a perder peso. El mdico podr sugerirle ejercicios especficos. IDEAS Y CONSEJOS PARA HACER EJERCICIOS  Elija opciones econmicas que disfrute hacer , como caminar, andar en bicicleta o los vdeos para ejercitarse.   Utilice las Microbiologist del ascensor.   Camine durante la hora del almuerzo.   Estacione el auto lejos del lugar de [place] o [place].   Concurra a un gimnasio o tome clases de gimnasia.   Comience con 5  10 minutos de actividad fsica por da. Ejercite hasta 30 minutos, 4 a 6 das por [address]   Utilice zapatos que  tengan un buen soporte y ropas cmodas.   Elongue antes y despus de Company secretary.   Ejercite hasta que aumente la respiracin y el corazn palpite rpido.   Beba agua extra cuando ejercite.   No haga ejercicio Firefighter, sentirse mareado o que le falte mucho el aire.  La actividad fsica puede quemar alrededor de 150 caloras.  Correr 20 cuadras en 15 minutos.   Jugar vley durante 45 a 60 minutos.   Limpiar y encerar el auto durante 45 a 60 minutos.  Jugar ftbol americano de toque.   Caminar 25 cuadras en 35 minutos.   Empujar un cochecito 20 cuadras en 30 minutos.   Jugar baloncesto durante 30 minutos.   Rastrillar hojas secas durante 30 minutos.   Andar en bicicleta 80 cuadras en 30 minutos.   Caminar 30 cuadras en 30 minutos.   Bailar durante 30 minutos.   Quitar la nieve con una pala durante 15 minutos.   Nadar vigorosamente durante 20 minutos.   Subir escaleras durante 15 minutos.   Andar en bicicleta 60 cuadras durante 15 minutos.   Arreglar el jardn entre 30 y 45 minutos.   Saltar a la soga durante 15 minutos.   Limpiar vidrios o pisos durante 45 a 60 minutos.  Document Released: 11/23/2010 Document Revised: 05/01/2011 [facility] Patient Information 2012 [place], [place].    Tomar una tableta diaria de calcio con vitamina D:  Caltrate o oscal o citracal o viactiv

## 2012-02-14 NOTE — Progress Notes (Signed)
Destiny Rios 04/16/1971 [id number]   History:    41 y.o.  for annual gyn exam with no complaints today. Her last mammogram was in 2012. Patient on Lutera 28 day oral contraceptive pill and is having normal menstrual cycles. Review of her record again she was weighing 185 last years down to 177. Dr. Beverely Low has done her labs recently.  Past medical history,surgical history, family history and social history were all reviewed and documented in the EPIC chart.  Gynecologic History Patient's last menstrual period was 01/24/2012. Contraception: OCP (estrogen/progesterone) Last Pap: 2011. Results were: normal Last mammogram: 2012. Results were: normal  Obstetric History OB History    Grav Para Term Preterm Abortions TAB SAB Ect Mult Living   2 2 2       2      # Outc Date GA Lbr Len/2nd Wgt Sex Del Anes PTL Lv   1 TRM     M SVD  No Yes   2 TRM     F SVD  No Yes       ROS: A ROS was performed and pertinent positives and negatives are included in the history.  GENERAL: No fevers or chills. HEENT: No change in vision, no earache, sore throat or sinus congestion. NECK: No pain or stiffness. CARDIOVASCULAR: No chest pain or pressure. No palpitations. PULMONARY: No shortness of breath, cough or wheeze. GASTROINTESTINAL: No abdominal pain, nausea, vomiting or diarrhea, melena or bright red blood per rectum. GENITOURINARY: No urinary frequency, urgency, hesitancy or dysuria. MUSCULOSKELETAL: No joint or muscle pain, no back pain, no recent trauma. DERMATOLOGIC: No rash, no itching, no lesions. ENDOCRINE: No polyuria, polydipsia, no heat or cold intolerance. No recent change in weight. HEMATOLOGICAL: No anemia or easy bruising or bleeding. NEUROLOGIC: No headache, seizures, numbness, tingling or weakness. PSYCHIATRIC: No depression, no loss of interest in normal activity or change in sleep pattern.     Exam: chaperone present  BP 128/82  Ht 4' 10.75" (1.492 m)  Wt 177 lb (80.287 kg)  BMI 36.05  kg/m2  LMP 01/24/2012  Body mass index is 36.05 kg/(m^2).  General appearance : Well developed well nourished female. No acute distress HEENT: Neck supple, trachea midline, no carotid bruits, no thyroidmegaly Lungs: Clear to auscultation, no rhonchi or wheezes, or rib retractions  Heart: Regular rate and rhythm, no murmurs or gallops Breast:Examined in sitting and supine position were symmetrical in appearance, no palpable masses or tenderness,  no skin retraction, no nipple inversion, no nipple discharge, no skin discoloration, no axillary or supraclavicular lymphadenopathy Abdomen: no palpable masses or tenderness, no rebound or guarding Extremities: no edema or skin discoloration or tenderness  Pelvic:  Bartholin, Urethra, Skene Glands: Within normal limits             Vagina: No gross lesions or discharge  Cervix: No gross lesions or discharge  Uterus  anteverted, normal size, shape and consistency, non-tender and mobile  Adnexa  Without masses or tenderness  Anus and perineum  normal   Rectovaginal  normal sphincter tone without palpated masses or tenderness             Hemoccult not done     Assessment/Plan:  41 y.o. female for annual exam with no abnormalities detected today. New Pap smear guidelines were discussed and she will not need one until next year. She was encouraged to do her monthly self breast examinations. A requisition was provided for her to schedule her mammogram. Literature information on exercise for weight  reduction was provided in [place]. Prescription refill was provided for oral contraceptive pill. We will see her back in one year or when necessary.    Ok Edwards MD, 11:06 AM 02/14/2012

## 2012-11-23 ENCOUNTER — Encounter: Payer: Self-pay | Admitting: Gynecology

## 2012-12-10 ENCOUNTER — Ambulatory Visit (INDEPENDENT_AMBULATORY_CARE_PROVIDER_SITE_OTHER): Payer: BC Managed Care – PPO | Admitting: Family Medicine

## 2012-12-10 ENCOUNTER — Encounter: Payer: Self-pay | Admitting: Family Medicine

## 2012-12-10 VITALS — BP 120/80 | HR 95 | Temp 98.8°F | Ht 58.5 in | Wt 174.6 lb

## 2012-12-10 DIAGNOSIS — H6690 Otitis media, unspecified, unspecified ear: Secondary | ICD-10-CM | POA: Insufficient documentation

## 2012-12-10 DIAGNOSIS — H6691 Otitis media, unspecified, right ear: Secondary | ICD-10-CM

## 2012-12-10 DIAGNOSIS — J0110 Acute frontal sinusitis, unspecified: Secondary | ICD-10-CM | POA: Insufficient documentation

## 2012-12-10 MED ORDER — AMOXICILLIN-POT CLAVULANATE 875-125 MG PO TABS: 1.0000 | ORAL_TABLET | Freq: Two times a day (BID) | ORAL | Status: DC

## 2012-12-10 MED ORDER — CYCLOBENZAPRINE HCL 10 MG PO TABS: 10.0000 mg | ORAL_TABLET | Freq: Three times a day (TID) | ORAL | Status: DC | PRN

## 2012-12-10 NOTE — Progress Notes (Signed)
  Subjective:    Patient ID: Destiny Rios, female    DOB: 1971/02/14, 42 y.o.   MRN: [id number]  HPI ? Sinusitis- sxs started Sunday.  + frontal pain.  + nasal congestion.  + green nasal discharge.  Bilateral ear popping, dizziness, vertigo yesterday.  + nausea.  No tooth pain.  No cough.  No fevers.  + sick contacts.   Review of Systems For ROS see HPI     Objective:   Physical Exam  Vitals reviewed. Constitutional: She appears well-developed and well-nourished. No distress.  HENT:  Head: Normocephalic and atraumatic.  Right Ear: Tympanic membrane is erythematous and bulging. A middle ear effusion is present.  Left Ear: Tympanic membrane is erythematous.  Nose: Mucosal edema and rhinorrhea present. Right sinus exhibits maxillary sinus tenderness and frontal sinus tenderness. Left sinus exhibits maxillary sinus tenderness and frontal sinus tenderness.  Mouth/Throat: Uvula is midline and mucous membranes are normal. Posterior oropharyngeal erythema present. No oropharyngeal exudate.  Eyes: Conjunctivae and EOM are normal. Pupils are equal, round, and reactive to light.  Neck: Normal range of motion. Neck supple.  Cardiovascular: Normal rate, regular rhythm and normal heart sounds.   Pulmonary/Chest: Effort normal and breath sounds normal. No respiratory distress. She has no wheezes.  Lymphadenopathy:    She has no cervical adenopathy.          Assessment & Plan:

## 2012-12-10 NOTE — Patient Instructions (Addendum)
This is a sinus and ear infection Start the Augmentin twice daily- take w/ food Drink plenty of fluids Tylenol and ibuprofen as needed for pain or fever REST! Hang in there!!!

## 2012-12-21 NOTE — Assessment & Plan Note (Signed)
New.  Start augmentin.  Reviewed supportive care and red flags that should prompt return.  Pt expressed understanding and is in agreement w/ plan.

## 2012-12-21 NOTE — Assessment & Plan Note (Signed)
New.  Start augmentin.  Reviewed supportive care and red flags that should prompt return.  Pt expressed understanding and is in agreement w/ plan.  

## 2013-01-23 ENCOUNTER — Other Ambulatory Visit: Payer: Self-pay | Admitting: Gynecology

## 2013-02-16 ENCOUNTER — Ambulatory Visit (INDEPENDENT_AMBULATORY_CARE_PROVIDER_SITE_OTHER): Payer: BC Managed Care – PPO | Admitting: Gynecology

## 2013-02-16 ENCOUNTER — Other Ambulatory Visit (HOSPITAL_COMMUNITY)
Admission: RE | Admit: 2013-02-16 | Discharge: 2013-02-16 | Disposition: A | Discharge status: Home / Self Care | Payer: BC Managed Care – PPO | Source: Ambulatory Visit | Attending: Gynecology | Admitting: Gynecology

## 2013-02-16 ENCOUNTER — Encounter: Payer: Self-pay | Admitting: Gynecology

## 2013-02-16 VITALS — BP 124/78 | Ht 58.5 in | Wt 178.0 lb

## 2013-02-16 DIAGNOSIS — Z1151 Encounter for screening for human papillomavirus (HPV): Secondary | ICD-10-CM | POA: Insufficient documentation

## 2013-02-16 DIAGNOSIS — Z23 Encounter for immunization: Secondary | ICD-10-CM

## 2013-02-16 DIAGNOSIS — Z01419 Encounter for gynecological examination (general) (routine) without abnormal findings: Secondary | ICD-10-CM

## 2013-02-16 DIAGNOSIS — IMO0001 Reserved for inherently not codable concepts without codable children: Secondary | ICD-10-CM

## 2013-02-16 DIAGNOSIS — M7918 Myalgia, other site: Secondary | ICD-10-CM

## 2013-02-16 DIAGNOSIS — R635 Abnormal weight gain: Secondary | ICD-10-CM

## 2013-02-16 MED ORDER — LEVONORGESTREL-ETHINYL ESTRAD 0.1-20 MG-MCG PO TABS: 1.0000 | ORAL_TABLET | Freq: Every day | ORAL | Status: DC

## 2013-02-16 MED ORDER — CYCLOBENZAPRINE HCL 5 MG PO TABS: ORAL_TABLET | ORAL | Status: DC

## 2013-02-16 NOTE — Patient Instructions (Signed)

## 2013-02-16 NOTE — Addendum Note (Signed)
Addended by: Bertram Savin A on: 02/16/2013 12:02 PM   Modules accepted: Orders

## 2013-02-16 NOTE — Progress Notes (Signed)
Destiny Rios 1971-06-06 [id number]   History:    42 y.o.  for annual gyn exam with no complaints today with the exception of next vaginal and since she has to do a lot of lifting at times at work. She is on Lutera 20 mcg oral contraceptive pill and is having normal menstrual cycle. Her last mammogram was March of this year which was normal. Patient with no prior history of abnormal Pap smear. She has gained weight in her current BMI is 36.57. Patient has not received the Tdap vaccine yet. Her primary physician is Dr. Beverely Low which she has not seen an ovary year.  Past medical history,surgical history, family history and social history were all reviewed and documented in the EPIC chart.  Gynecologic History Patient's last menstrual period was 01/16/2013. Contraception: OCP (estrogen/progesterone) Last Pap: 2011. Results were: normal Last mammogram: 2014. Results were: normal  Obstetric History OB History   Grav Para Term Preterm Abortions TAB SAB Ect Mult Living   2 2 2       2      # Outc Date GA Lbr Len/2nd Wgt Sex Del Anes PTL Lv   1 TRM     M SVD  No Yes   2 TRM     F SVD  No Yes       ROS: A ROS was performed and pertinent positives and negatives are included in the history.  GENERAL: No fevers or chills. HEENT: No change in vision, no earache, sore throat or sinus congestion. NECK: spasms. CARDIOVASCULAR: No chest pain or pressure. No palpitations. PULMONARY: No shortness of breath, cough or wheeze. GASTROINTESTINAL: No abdominal pain, nausea, vomiting or diarrhea, melena or bright red blood per rectum. GENITOURINARY: No urinary frequency, urgency, hesitancy or dysuria. MUSCULOSKELETAL: No joint or muscle pain, no back pain, no recent trauma. DERMATOLOGIC: No rash, no itching, no lesions. ENDOCRINE: No polyuria, polydipsia, no heat or cold intolerance. No recent change in weight. HEMATOLOGICAL: No anemia or easy bruising or bleeding. NEUROLOGIC: No headache, seizures, numbness, tingling  or weakness. PSYCHIATRIC: No depression, no loss of interest in normal activity or change in sleep pattern.     Exam: chaperone present  BP 124/78  Ht 4' 10.5" (1.486 m)  Wt 178 lb (80.74 kg)  BMI 36.56 kg/m2  LMP 01/16/2013  Body mass index is 36.56 kg/(m^2).  General appearance : Well developed well nourished female. No acute distress HEENT: Neck supple, trachea midline, no carotid bruits, no thyroidmegaly Lungs: Clear to auscultation, no rhonchi or wheezes, or rib retractions  Heart: Regular rate and rhythm, no murmurs or gallops Breast:Examined in sitting and supine position were symmetrical in appearance, no palpable masses or tenderness,  no skin retraction, no nipple inversion, no nipple discharge, no skin discoloration, no axillary or supraclavicular lymphadenopathy Abdomen: no palpable masses or tenderness, no rebound or guarding Extremities: no edema or skin discoloration or tenderness  Pelvic:  Bartholin, Urethra, Skene Glands: Within normal limits             Vagina: No gross lesions or discharge  Cervix: No gross lesions or discharge  Uterus  anteverted, normal size, shape and consistency, non-tender and mobile  Adnexa  Without masses or tenderness  Anus and perineum  normal   Rectovaginal  normal sphincter tone without palpated masses or tenderness             Hemoccult that indicated     Assessment/Plan:  42 y.o. female for annual exam with next hasn't set  time from work. She will be prescribed Flexeril 5 mg to take one by mouth at bedtime when necessary. I've recommended she take Motrin 3 times a day during the day. Prescription refill for oral contraceptive pill was provided. Her Pap smear was done today. Tdap vaccine was administered as well as literature and information was provided. The following labs were ordered: CBC, fasting lipid profile, comprehensive metabolic panel, TSH and urinalysis. Patient was reminded to do her monthly self breast examination. We  discussed importance of good nutrition and regular exercise.    Ok Edwards MD, 11:27 AM 02/16/2013

## 2013-02-17 LAB — URINALYSIS W MICROSCOPIC + REFLEX CULTURE
Crystals: NONE SEEN
Leukocytes, UA: NEGATIVE
Protein, ur: NEGATIVE mg/dL
Specific Gravity, Urine: 1.027 (ref 1.005–1.030)
Urobilinogen, UA: 0.2 mg/dL (ref 0.0–1.0)

## 2013-02-19 ENCOUNTER — Other Ambulatory Visit: Payer: Self-pay | Admitting: Gynecology

## 2013-02-19 LAB — URINE CULTURE

## 2013-02-19 MED ORDER — NITROFURANTOIN MONOHYD MACRO 100 MG PO CAPS: 100.0000 mg | ORAL_CAPSULE | Freq: Two times a day (BID) | ORAL | Status: DC

## 2013-02-23 ENCOUNTER — Encounter: Payer: Self-pay | Admitting: Gynecology

## 2013-03-29 ENCOUNTER — Ambulatory Visit (INDEPENDENT_AMBULATORY_CARE_PROVIDER_SITE_OTHER): Payer: BC Managed Care – PPO | Admitting: Gynecology

## 2013-03-29 ENCOUNTER — Encounter: Payer: Self-pay | Admitting: Gynecology

## 2013-03-29 VITALS — BP 132/88

## 2013-03-29 DIAGNOSIS — N9489 Other specified conditions associated with female genital organs and menstrual cycle: Secondary | ICD-10-CM

## 2013-03-29 DIAGNOSIS — R102 Pelvic and perineal pain: Secondary | ICD-10-CM

## 2013-03-29 LAB — URINALYSIS W MICROSCOPIC + REFLEX CULTURE
Casts: NONE SEEN
Crystals: NONE SEEN
Glucose, UA: NEGATIVE mg/dL
Ketones, ur: NEGATIVE mg/dL
Leukocytes, UA: NEGATIVE
Nitrite: NEGATIVE
Specific Gravity, Urine: 1.025 (ref 1.005–1.030)
pH: 6 (ref 5.0–8.0)

## 2013-03-29 MED ORDER — NITROFURANTOIN MONOHYD MACRO 100 MG PO CAPS: 100.0000 mg | ORAL_CAPSULE | Freq: Two times a day (BID) | ORAL | Status: DC

## 2013-03-29 NOTE — Progress Notes (Signed)
Patient presented to the office today with complaint of pelvic pressure, low back discomfort and strong urine as well as frequency and urgency. Patient was seen in the office in June for her annual exam an incidental finding on urinalysis and culture demonstrated an organism such as Klebsiella was identified. She was placed on the appropriate antibiotic based on the sensitivity and it was Macrobid one by mouth twice a day for 7 days. Patient denied any fever chills nausea or vomiting and no true vaginal discharge. Patient states that sometimes she gets urinary tract infections after intercourse. Patient with normal menstrual cycles on oral contraceptive pill  Review of labs at time of her annual exam in June of this year demonstrated normal CBC, comprehensive metabolic panel, lipid profile, blood sugar, TSH and Pap smear. Her mammogram is up-to-date and normal.  Assessment/plan: Back: No CVA tenderness Abdomen: Soft some suprapubic tenderness is noted Cervix: No lesions or discharge Uterus: Anteverted suprapubic tenderness is noted Adnexa: No palpable mass or tenderness  Urinalysis: 0-2 WBC, 7-10 rbc, few bacteria  Assessment/plan: Clinical symptoms highly suspicious for urinary tract infection. Patient was given a sample of Uribel to take one by mouth 4 times a day for 2 days to help with bladder spasm. She will be placed on the same antibiotic that was sensitive to Klebsiella back in June. She'll be prescribed Macrobid 100 mg one by mouth twice a day for 7 days. I have given her additional prescription for her to take one tablet after intercourse. Uterus: Anteverted normal size shape and consistency

## 2013-03-29 NOTE — Patient Instructions (Addendum)
  Infeccin urinaria  (Urinary Tract Infection)  La infeccin urinaria puede ocurrir en cualquier lugar del tracto urinario. El tracto urinario es un sistema de drenaje del cuerpo por el que se eliminan los desechos y el exceso de agua. El tracto urinario est formado por dos riones, dos urteres, la vejiga y la uretra. Los riones son rganos que tienen forma de frijol. Cada rin tiene aproximadamente el tamao del puo. Estn situados debajo de las costillas, uno a cada lado de la columna vertebral CAUSAS  La causa de la infeccin son los microbios, que son organismos microscpicos, que incluyen hongos, virus, y bacterias. Estos organismos son tan pequeos que slo pueden verse a travs del microscopio. Las bacterias son los microorganismos que ms comnmente causan infecciones urinarias.  SNTOMAS  Los sntomas pueden variar segn la edad y el sexo del paciente y por la ubicacin de la infeccin. Los sntomas en las mujeres jvenes incluyen la necesidad frecuente e intensa de orinar y una sensacin dolorosa de ardor en la vejiga o en la uretra durante la miccin. Las mujeres y los hombres mayores podrn sentir cansancio, temblores y debilidad y sentir dolores musculares y dolor abdominal. Si tiene fiebre, puede significar que la infeccin est en los riones. Otros sntomas son dolor en la espalda o en los lados debajo de las costillas, nuseas y vmitos.  DIAGNSTICO  Para diagnosticar una infeccin urinaria, el mdico le preguntar acerca de sus sntomas. Tambin le solicitar una muestra de orina. La muestra de orina se analiza para detectar bacterias y glbulos blancos de la sangre. Los glbulos blancos se forman en el organismo para ayudar a combatir las infecciones.  TRATAMIENTO  Por lo general, las infecciones urinarias pueden tratarse con medicamentos. Debido a que la mayora de las infecciones son causadas por bacterias, por lo general pueden tratarse con antibiticos. La eleccin del  antibitico y la duracin del tratamiento depender de sus sntomas y el tipo de bacteria causante de la infeccin.  INSTRUCCIONES PARA EL CUIDADO EN EL HOGAR   Si le recetaron antibiticos, tmelos exactamente como su mdico le indique. Termine el medicamento aunque se sienta mejor despus de haber tomado slo algunos.  Beba gran cantidad de lquido para mantener la orina de tono claro o color amarillo plido.  Evite la cafena, el t y las bebidas gaseosas. Estas sustancias irritan la vejiga.  Vaciar la vejiga con frecuencia. Evite retener la orina durante largos perodos.  Vace la vejiga antes y despus de tener relaciones sexuales.  Despus de mover el intestino, las mujeres deben higienizarse la regin perineal desde adelante hacia atrs. Use slo un papel tissue por vez. SOLICITE ATENCIN MDICA SI:   Siente dolor en la espalda.  Le sube la fiebre.  Los sntomas no mejoran luego de 3 das. SOLICITE ATENCIN MDICA DE INMEDIATO SI:   Siente dolor intenso en la espalda o en la zona inferior del abdomen.  Comienza a sentir escalofros.  Tiene nuseas o vmitos.  Tiene una sensacin continua de quemazn o molestias al orinar. ASEGRESE DE QUE:   Comprende estas instrucciones.  Controlar su enfermedad.  Solicitar ayuda de inmediato si no mejora o empeora. Document Released: 05/29/2005 Document Revised: 05/13/2012 ExitCare Patient Information 2014 [employer].  

## 2013-08-18 ENCOUNTER — Encounter: Payer: Self-pay | Admitting: Family Medicine

## 2013-08-18 ENCOUNTER — Ambulatory Visit (INDEPENDENT_AMBULATORY_CARE_PROVIDER_SITE_OTHER): Payer: BC Managed Care – PPO | Admitting: Family Medicine

## 2013-08-18 VITALS — BP 124/86 | HR 81 | Temp 98.0°F | Resp 16 | Wt 180.4 lb

## 2013-08-18 DIAGNOSIS — J0110 Acute frontal sinusitis, unspecified: Secondary | ICD-10-CM

## 2013-08-18 MED ORDER — FLUTICASONE PROPIONATE 50 MCG/ACT NA SUSP: 2.0000 | Freq: Every day | NASAL | Status: DC

## 2013-08-18 MED ORDER — CYCLOBENZAPRINE HCL 10 MG PO TABS: 10.0000 mg | ORAL_TABLET | Freq: Three times a day (TID) | ORAL | Status: AC | PRN

## 2013-08-18 MED ORDER — AMOXICILLIN 875 MG PO TABS: 875.0000 mg | ORAL_TABLET | Freq: Two times a day (BID) | ORAL | Status: DC

## 2013-08-18 NOTE — Progress Notes (Signed)
   Subjective:    Patient ID: Destiny Rios, female    DOB: 1970/11/03, 42 y.o.   MRN: [id number]  HPI URI- sxs started Saturday w/ HA.  + sinus pain, ear pain, nasal congestion.  No fever.  + nausea, no vomiting.  + sick contacts.  + tooth pain.  Minimal cough.  Needs refill on Flonase.  Taking Zyrtec.   Review of Systems For ROS see HPI     Objective:   Physical Exam  Vitals reviewed. Constitutional: She appears well-developed and well-nourished. No distress.  HENT:  Head: Normocephalic and atraumatic.  Right Ear: Tympanic membrane normal.  Left Ear: Tympanic membrane normal.  Nose: Mucosal edema and rhinorrhea present. Right sinus exhibits maxillary sinus tenderness and frontal sinus tenderness. Left sinus exhibits maxillary sinus tenderness and frontal sinus tenderness.  Mouth/Throat: Uvula is midline and mucous membranes are normal. Posterior oropharyngeal erythema present. No oropharyngeal exudate.  Eyes: Conjunctivae and EOM are normal. Pupils are equal, round, and reactive to light.  Neck: Normal range of motion. Neck supple.  Cardiovascular: Normal rate, regular rhythm and normal heart sounds.   Pulmonary/Chest: Effort normal and breath sounds normal. No respiratory distress. She has no wheezes.  Lymphadenopathy:    She has no cervical adenopathy.          Assessment & Plan:

## 2013-08-18 NOTE — Patient Instructions (Signed)
Follow up as needed Start the Amoxicillin twice daily- take w/ food Drink plenty of fluids REST! Continue the nasal spray and use the flexeril as needed Call with any questions or concerns Happy Holidays!!

## 2013-08-18 NOTE — Assessment & Plan Note (Signed)
Pt's sxs and PE consistent w/ infxn.  Start abx.  Reviewed supportive care and red flags that should prompt return.  Pt expressed understanding and is in agreement w/ plan.  

## 2013-08-18 NOTE — Progress Notes (Signed)
Pre visit review using our clinic review tool, if applicable. No additional management support is needed unless otherwise documented below in the visit note. 

## 2013-09-01 ENCOUNTER — Encounter: Payer: Self-pay | Admitting: Gynecology

## 2013-09-01 ENCOUNTER — Ambulatory Visit (INDEPENDENT_AMBULATORY_CARE_PROVIDER_SITE_OTHER): Payer: BC Managed Care – PPO | Admitting: Gynecology

## 2013-09-01 VITALS — BP 120/82

## 2013-09-01 DIAGNOSIS — N898 Other specified noninflammatory disorders of vagina: Secondary | ICD-10-CM

## 2013-09-01 DIAGNOSIS — Z113 Encounter for screening for infections with a predominantly sexual mode of transmission: Secondary | ICD-10-CM

## 2013-09-01 DIAGNOSIS — H8110 Benign paroxysmal vertigo, unspecified ear: Secondary | ICD-10-CM

## 2013-09-01 DIAGNOSIS — B373 Candidiasis of vulva and vagina: Secondary | ICD-10-CM

## 2013-09-01 LAB — WET PREP FOR TRICH, YEAST, CLUE
Clue Cells Wet Prep HPF POC: NONE SEEN
Trich, Wet Prep: NONE SEEN

## 2013-09-01 MED ORDER — NITROFURANTOIN MONOHYD MACRO 100 MG PO CAPS: ORAL_CAPSULE | ORAL | Status: DC

## 2013-09-01 MED ORDER — MECLIZINE HCL 12.5 MG PO TABS: 12.5000 mg | ORAL_TABLET | Freq: Three times a day (TID) | ORAL | Status: DC | PRN

## 2013-09-01 MED ORDER — FLUCONAZOLE 150 MG PO TABS: 150.0000 mg | ORAL_TABLET | Freq: Once | ORAL | Status: DC

## 2013-09-01 NOTE — Patient Instructions (Addendum)
Vrtigo (Vertigo)  Vrtigo es la sensacin de que se est moviendo estando quieto. Puede ser peligroso si ocurre cuando est trabajado, conduciendo vehculos o realizando actividades difciles.  CAUSAS  El vrtigo se produce cuando hay un conflicto en las seales que se envan al cerebro desde los sistemas visual y sensorial del cuerpo. Hay numerosas causas que [employer], entre las que se incluyen:   Infecciones, especialmente en el odo interno.  Nelia Shi reaccin a un medicamento o mal uso de alcohol y frmacos.  Abstinencia de drogas o alcohol.  Cambios rpidos de posicin, como al [employer] o darse vuelta en la cama.  Dolor de Surveyor, minerals.  Disminucin del flujo sanguneo hacia el cerebro.  Aumento de la presin en el cerebro por un traumatismo, infeccin, tumor o sangrado en la cabeza. SNTOMAS  Puede sentir como si el mundo da vueltas o va a caer al piso. Como hay problemas en el equilibrio, el vrtigo puede causar nuseas y vmitos. Tiene movimientos oculares involuntarios (nistagmus).  DIAGNSTICO  El vrtigo normalmente se diagnostica con un examen fsico. Si la causa no se conoce, el mdico puede indicar diagnstico por imgenes, como una resonancia magntica (imgenes por Health visitor).  TRATAMIENTO  La mayor parte de los casos de vrtigo se resuelve sin TEFL teacher. Segn la causa, el mdico podr recetar ciertos medicamentos. Si se relaciona con la posicin del cuerpo, podr recomendarle movimientos o procedimientos para corregir el problema. En algunos casos raros, si la causa del vrtigo es un problema en el odo interno, necesitar [place].  INSTRUCCIONES PARA EL CUIDADO DOMICILIARIO  Siga las indicaciones del mdico.  Evite conducir vehculos.  Evite operar maquinarias pesadas.  Evite realizar tareas que seran peligrosas para usted u otras personas durante un episodio de vrtigo.  Comunquele al mdico si nota que ciertos medicamentos  parecen asociarse con las crisis. Algunos medicamentos que se usan para tratar los episodios, en [employer]. SOLICITE ATENCIN MDICA DE INMEDIATO SI:  Los medicamentos no [place] las crisis o hacen que estas empeoren.  Tiene dificultad para hablar, caminar, siente debilidad o tiene problemas para [employer], las manos o las piernas.  Comienza a sufrir un dolor de cabeza intenso.  Las nuseas y los vmitos no se [place] o se Press photographer.  Aparecen trastornos visuales.  Un miembro de su familia nota cambios en su conducta.  Hay alguna modificacin en su trastorno que parece Holiday representative de Scientist, clinical (histocompatibility and immunogenetics). ASEGRESE DE QUE:   Comprende estas instrucciones.  Controlar su enfermedad.  Solicitar ayuda de inmediato si no mejora o si empeora. Document Released: 05/29/2005 Document Revised: 11/11/2011 [facility] Patient Information 2014 [place], [place]. Vaginitis monilisica (Monilial Vaginitis) La vaginitis es una inflamacin (irritacin, hinchazn) de la vagina y la vulva. Esta no es una enfermedad de transmisin sexual.  CAUSAS Este tipo de vaginitis lo causa un hongo (candida) que normalmente se encuentra en la vagina. El hongo candida se ha desarrollado hasta el punto de ocasionar problemas en el equilibrio qumico. SNTOMAS  Secrecin vaginal espesa y blanca.  Hinchazn, picazn, enrojecimiento e inflamacin de la vagina y en algunos casos de los labios vaginales (vulva).  Ardor o dolor al [employer].  Dolor en las relaciones sexuales. DIAGNSTICO Los factores que favorecen la vaginitis moniliasica son:  Everlean Patterson de virginidad y postmenopusicas.  Embarazo.  Infecciones.  Sentir cansancio, estar enferma o estresada, especialmente si ya ha sufrido este problema en el pasado.  Diabetes Buen control ayudar a disminur la probabilidad.  Pldoras anticonceptivas  Ropa interior [place].  El uso de espumas de bao, aerosoles femeninos duchas  vaginales o tampones con desodorante.  Algunos antibiticos (medicamentos que destruyen grmenes).  Si contrae alguna enfermedad puede sufrir recurrencias espordicas. TRATAMIENTO El profesional que lo asiste prescribir medicamentos.  Hay diferentes tipos de cremas y supositorios vaginales que tratan especficamente la vaginitis monilisica. Para infecciones por hongos recurrentes, utilice un supositorio o crema en la vagina dos veces por semana, o segn se le indique.  Tambin podrn utilizarse cremas con corticoides o anti monilisicas para la picazn o la irritacin de la vulva. Consulte con el profesional que la asiste.  Si la crema no da resultado, podr aplicarse en la vagina una solucin con azul de metileno.  El consumo de yogur puede prevenir este tipo de vaginitis. INSTRUCCIONES PARA EL CUIDADO DOMICILIARIO  Tome todos los medicamentos tal como se le indic.  No mantenga relaciones sexuales hasta que el tratamiento se haya completado, o segn las indicaciones del profesional que la asiste.  Tome baos de asiento tibios.  No se aplique duchas vaginales.  No utilice tampones, especialmente los perfumados.  Use ropa interior de algodn  Mirant pantalones ajustados y las medias tipo panty.  Comunique a sus compaeros sexuales que sufre una infeccin por hongos. Ellos deben concurrir para un control mdico si tienen sntomas como una urticaria leve o picazn.  Sus compaeros sexuales deben tratarse tambin si la infeccin es difcil de Pharmacologist.  Practique el sexo seguro  use condones  Algunos medicamentos vaginales ocasionan fallas en los condones de ltex. Los medicamentos vaginales que pueden daar los condones son:  Chiropodist cleocina  Butoconazole (Femstat)  Terconazole (Terazol) supositorios vaginales  Miconazole (Monistat) (es un medicamento de venta libre) SOLICITE ATENCIN MDICA SI:  Daphane Shepherd tiene una temperatura oral de ms de 38,9 C (102 F).  Si  la infeccin empeora luego de 2 [address]  Si la infeccin no mejora luego de 3 [address].  Aparecen ampollas en o alrededor de la vagina.  Si aparece una hemorragia vaginal y no es el momento del perodo.  Siente dolor al [employer].  Presenta problemas intestinales.  Tiene dolor durante las [employer]. Document Released: 05/29/2005 Document Revised: 11/11/2011 [facility] Patient Information 2014 [place], [place].

## 2013-09-01 NOTE — Progress Notes (Signed)
  patient presented to the office today with several complaints. The patient stated that 2 weeks ago she had been treated for a sinus infection by her PCP with amoxicillin for 10 days. She has been complaining of vulvar pruritus and a white discharge and irritation. She states that her husband has had the same symptoms. She also stated that if she lives upper head very quickly she might get a little lightheaded. Patient is on oral contraceptive pill for contraception.  Exam: Dix-Hallpike  Maneuver was positive  Pelvic exam: Bartholin urethra Skene was within normal limits Vagina: White discharge was noted there were 3 minutes vaginal mucosa. Cervix: Same as above Bimanual exam: Not done Rectal exam: Not done  Wet prep moderate yeast  Assessment/plan: #1 vaginal discharge consistent with yeast infection and will be treated with Diflucan 150 mg one by mouth today. #2 patient complains of urinary dysuria and frequency after intercourse will be started on Macrobid one by mouth after intercourse prophylactically. She denied any dysuria or frequency today just completed 10 days of amoxicillin. #3 patient with benign paroxysmal positional vertigo will be prescribed meclizine 25 mg to take one by mouth twice a day to 3 times a day when necessary for the next 7-10 days. If the symptoms do not improve she will be referred to the ENT for further evaluation. A GC and chlamydia culture was obtained also.

## 2013-09-03 LAB — GC/CHLAMYDIA PROBE AMP
CT Probe RNA: NEGATIVE
GC Probe RNA: NEGATIVE

## 2014-01-31 ENCOUNTER — Other Ambulatory Visit: Payer: Self-pay | Admitting: Gynecology

## 2014-02-24 ENCOUNTER — Ambulatory Visit (INDEPENDENT_AMBULATORY_CARE_PROVIDER_SITE_OTHER): Payer: 59 | Admitting: Gynecology

## 2014-02-24 ENCOUNTER — Encounter: Payer: Self-pay | Admitting: Gynecology

## 2014-02-24 VITALS — BP 120/80 | Ht 59.0 in | Wt 181.2 lb

## 2014-02-24 DIAGNOSIS — Z833 Family history of diabetes mellitus: Secondary | ICD-10-CM

## 2014-02-24 DIAGNOSIS — Z01419 Encounter for gynecological examination (general) (routine) without abnormal findings: Secondary | ICD-10-CM

## 2014-02-24 DIAGNOSIS — R635 Abnormal weight gain: Secondary | ICD-10-CM

## 2014-02-24 DIAGNOSIS — Z13220 Encounter for screening for lipoid disorders: Secondary | ICD-10-CM

## 2014-02-24 LAB — URINALYSIS W MICROSCOPIC + REFLEX CULTURE
BILIRUBIN URINE: NEGATIVE
CRYSTALS: NONE SEEN
Casts: NONE SEEN
Glucose, UA: NEGATIVE mg/dL
Ketones, ur: NEGATIVE mg/dL
Leukocytes, UA: NEGATIVE
Nitrite: NEGATIVE
PROTEIN: NEGATIVE mg/dL
Urobilinogen, UA: 0.2 mg/dL (ref 0.0–1.0)
WBC, UA: NONE SEEN WBC/hpf (ref <3)
pH: 5 (ref 5.0–8.0)

## 2014-02-24 MED ORDER — NITROFURANTOIN MONOHYD MACRO 100 MG PO CAPS: ORAL_CAPSULE | ORAL | Status: DC

## 2014-02-24 MED ORDER — LEVONORGESTREL-ETHINYL ESTRAD 0.1-20 MG-MCG PO TABS: 1.0000 | ORAL_TABLET | Freq: Every day | ORAL | Status: DC

## 2014-02-24 NOTE — Patient Instructions (Signed)
Autoexamen de mamas  (Breast Self-Awareness) El autoexamen de mamas puede detectar problemas de manera temprana, prevenir complicaciones mdicas significativas y posiblemente salvar su vida. Al hacerlo, podr familiarizarse con el aspecto y forma de sus mamas, y observar cambios. Esto le permite descubrir cambios de manera precoz. Este autoexamen mensual le ofrece la tranquilidad de que sus senos estn en buen estado de salud. Una forma de aprender qu es normal para sus mamas y si sufren modificaciones es hacer un autoexamen.   Si encuentra un bulto o algo que no estaba presente anteriormente, lo mejor es ponerse en contacto con su mdico inmediatamente. Otro hallazgo que debe ser evaluado por su mdico es la secrecin del pezn, especialmente si es con sangre; cambios en la piel o enrojecimiento; reas donde la piel parece estar tironeada (retrada) o nuevos bultos o protuberancias. El dolor en los senos es rara vez se asocia con el cncer (malignidad), pero tambin debe ser evaluado por un mdico.  CMO REALIZAR EL AUTOEXAMEN DE MAMAS  El mejor momento para examinar sus mamas es a los 5 a 7 das despus de finalizado el perodo menstrual. Durante la menstruacin, las mamas estn ms abultadas y puede haber ms dificultad para encontrar modificaciones. Si no menstra, ha llegado a la menopausia, o le han extirpado el tero (histerectomia), usted debe examinar sus senos a intervalos regulares, por ejemplo cada mes. Si est amamantando, examine sus senos despus de alimentar al beb o despus de usar un extractor de leche. Los implantes mamarios no disminuyen el riesgo de bultos o tumores, por lo que debe seguir realizando el autoexamen de mamas como se recomienda. Hable con su mdico acerca de cmo determinar la diferencia entre el implante y el tejido mamario. Adems, debe consultar cuanta presin debe hacer durante el examen. Con el tiempo se familiarizar con las variaciones de las mamas y se sentir ms  cmoda para realizar el examen. Para el autoexamen deber quitarse toda la ropa de la cintura para arriba.  1.  Observe sus senos y pezones. Prese frente a un espejo en una habitacin con buena iluminacin. Con las manos en las caderas, presione las manos firmemente hacia abajo. Busque diferencias en la forma, el contorno y el tamao de un pecho al otro (asimetras). Entre las asimetras se incluyen arrugas, depresiones o protuberancias. Tambin, busque cambios en la piel, como reas enrojecidas o escamosas. Busque cambios en los pezones, como secreciones, hoyuelos, cambios en la posicin, o enrojecimiento. 2. Palpe cuidadosamente sus senos. Es mucho mejor hacerlo en la ducha o en la baera, mientras usa jabn o cuando est recostada sobre su espalda. Coloque el brazo (en el lado de la mama que se examina) por arriba de la cabeza. Use las yemas (no las puntas) de los tres dedos centrales de la mano opuesta para palpar. Comience en la zona de la axila, haga crculos de  de pulgada (2 cm) y vaya superponindolos. Utilice 3 niveles diferentes de presin (ligero, medio y firme) en cada crculo antes de pasar al siguiente. Se necesita una presin ligera para sentir los tejidos ms cercanos a la piel. La presin media ayudar a sentir el tejido mamario un poco ms profundo, mientras que se necesita una presin firme para palpar el tejido que se encuentra cerca de las costillas. Continuar superponiendo crculos y vaya hacia abajo, hasta sentir las costillas, por debajo del pecho. Luego mueva un espacio del ancho de un dedo hacia el centro del cuerpo. Siga con los crculos del  de pulgada (  2 cm) mientras va lentamente hacia la clavcula, cerca de la base del cuello. Contine con el examen hacia arriba y hacia abajo con las 3 intensidades de presin hasta llegar a la mitad del pecho. Hgalo con cada seno cuidadosamente, buscando bultos o modificaciones. 3. Debe llevar un registro escrito con los cambios o los hallazgos  normales que encuentre para cada seno. Si registra esta informacin, no tiene que depender slo de la memoria para recordar el tamao, la sensibilidad o la ubicacin de los hallazgos. Anote en qu momento se encuentra del ciclo menstrual, si usted todava est menstruando. El tejido mamario puede tener algunos bultos o tejidos engrosados. Sin embargo, consulte a su mdico si usted encuentra algo que le preocupa.   SOLICITE ATENCIN MDICA SI:   Observa cambios en la forma, en el contorno o el tamao de las mamas o los pezones.   Hay modificaciones en la piel, como zonas enrojecidas o escamosas en las mamas o en los pezones.   Tiene una secrecin anormal en los pezones.   Siente un nuevo bulto o reas engrosadas de manera anormal.  Document Released: 08/19/2005 Document Revised: 08/05/2012 ExitCare Patient Information 2015 ExitCare[employer]. This information is not intended to replace advice given to you by your health care provider. Make sure you discuss any questions you have with your health care provider.  

## 2014-02-24 NOTE — Addendum Note (Signed)
Addended by: Richardson ChiquitoWILKINSON, KARI S on: 02/24/2014 12:45 PM   Modules accepted: Orders

## 2014-02-24 NOTE — Progress Notes (Signed)
Destiny Rios 03/29/1971 [id number]   History:    43 y.o.  for annual gyn exam with only complaint is of urine having an odor. 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 She's currently on Aviane 28 day oral contraceptive pills and is having regular monthly light cycles. Patient has not seen her PCP in 2 years and is currently fasting and would like to have her blood work drawn today. She has had issues with her weight. Patient with no past history of abnormal Pap smear. Patient overdue for her mammogram.  Past medical history,surgical history, family history and social history were all reviewed and documented in the EPIC chart.  Gynecologic History Patient's last menstrual period was 02/24/2014. Contraception: OCP (estrogen/progesterone) Last Pap: 2014. Results were: normal Last mammogram: 2014. Results were: normal  Obstetric History OB History  Gravida Para Term Preterm AB SAB TAB Ectopic Multiple Living  2 2 2       2     # Outcome Date GA Lbr Len/2nd Weight Sex Delivery Anes PTL Lv  2 TRM     F SVD  N Y  1 TRM     M SVD  N Y       ROS: A ROS was performed and pertinent positives and negatives are included in the history.  GENERAL: No fevers or chills. HEENT: No change in vision, no earache, sore throat or sinus congestion. NECK: No pain or stiffness. CARDIOVASCULAR: No chest pain or pressure. No palpitations. PULMONARY: No shortness of breath, cough or wheeze. GASTROINTESTINAL: No abdominal pain, nausea, vomiting or diarrhea, melena or bright red blood per rectum. GENITOURINARY: No urinary frequency, urgency, hesitancy or dysuria. MUSCULOSKELETAL: No joint or muscle pain, no back pain, no recent trauma. DERMATOLOGIC: No rash, no itching, no lesions. ENDOCRINE: No polyuria, polydipsia, no heat or cold intolerance. No recent change in weight. HEMATOLOGICAL: No anemia or easy bruising or bleeding. NEUROLOGIC: No headache, seizures, numbness,  tingling or weakness. PSYCHIATRIC: No depression, no loss of interest in normal activity or change in sleep pattern.     Exam: chaperone present  BP 120/80  Ht 4\' 11"  (1.499 m)  Wt 181 lb 3.2 oz (82.192 kg)  BMI 36.58 kg/m2  LMP 02/24/2014  Body mass index is 36.58 kg/(m^2).  General appearance : Well developed well nourished female. No acute distress HEENT: Neck supple, trachea midline, no carotid bruits, no thyroidmegaly Lungs: Clear to auscultation, no rhonchi or wheezes, or rib retractions  Heart: Regular rate and rhythm, no murmurs or gallops Breast:Examined in sitting and supine position were symmetrical in appearance, no palpable masses or tenderness,  no skin retraction, no nipple inversion, no nipple discharge, no skin discoloration, no axillary or supraclavicular lymphadenopathy Abdomen: no palpable masses or tenderness, no rebound or guarding Extremities: no edema or skin discoloration or tenderness  Pelvic:  Bartholin, Urethra, Skene Glands: Within normal limits             Vagina: No gross lesions or discharge, menstrual blood  Cervix: No gross lesions or discharge  Uterus  anteverted, normal size, shape and consistency, non-tender and mobile  Adnexa  Without masses or tenderness  Anus and perineum  normal   Rectovaginal  normal sphincter tone without palpated masses or tenderness             Hemoccult not indicated   Urinalysis: Few bacteria, RBC (patient menstruating)  Assessment/Plan:  43 y.o. female for annual exam doing well on oral contraceptive pills.  Pap smear not done today in accordance to the new guidelines. Patient was given a requisition to schedule her mammogram. We discussed importance of monthly breast exam. We discussed importance of calcium and vitamin D in regular exercise for osteoporosis prevention. The following labs were ordered: Fasting lipid profile, comprehensive metabolic panel, TSH, CBC, and urinalysis. Urine culture submitted. Patient  returned back in 1 year or when necessary.  Note: This dictation was prepared with  Dragon/digital dictation along withSmart phrase technology. Any transcriptional errors that result from this process are unintentional.   Ok EdwardsFERNANDEZ,JUAN H MD, 10:38 AM 02/24/2014

## 2014-02-25 LAB — URINE CULTURE
Colony Count: NO GROWTH
Organism ID, Bacteria: NO GROWTH

## 2014-03-01 ENCOUNTER — Other Ambulatory Visit: Payer: Self-pay | Admitting: Gynecology

## 2014-03-01 DIAGNOSIS — R319 Hematuria, unspecified: Secondary | ICD-10-CM

## 2014-03-07 ENCOUNTER — Other Ambulatory Visit: Payer: 59

## 2014-03-07 DIAGNOSIS — R319 Hematuria, unspecified: Secondary | ICD-10-CM

## 2014-03-08 LAB — URINALYSIS W MICROSCOPIC + REFLEX CULTURE
Bacteria, UA: NONE SEEN
Bilirubin Urine: NEGATIVE
Casts: NONE SEEN
Crystals: NONE SEEN
GLUCOSE, UA: NEGATIVE mg/dL
HGB URINE DIPSTICK: NEGATIVE
Ketones, ur: NEGATIVE mg/dL
LEUKOCYTES UA: NEGATIVE
Nitrite: NEGATIVE
Protein, ur: NEGATIVE mg/dL
Specific Gravity, Urine: 1.025 (ref 1.005–1.030)
Urobilinogen, UA: 0.2 mg/dL (ref 0.0–1.0)
pH: 5 (ref 5.0–8.0)

## 2014-06-01 ENCOUNTER — Encounter: Payer: Self-pay | Admitting: Gynecology

## 2014-07-04 ENCOUNTER — Encounter: Payer: Self-pay | Admitting: Gynecology

## 2015-01-30 ENCOUNTER — Other Ambulatory Visit: Payer: Self-pay | Admitting: Gynecology

## 2015-02-25 ENCOUNTER — Other Ambulatory Visit: Payer: Self-pay | Admitting: Gynecology

## 2015-03-01 ENCOUNTER — Encounter: Payer: Self-pay | Admitting: Gynecology

## 2015-03-17 ENCOUNTER — Encounter: Payer: Self-pay | Admitting: Gynecology

## 2015-03-17 ENCOUNTER — Ambulatory Visit (INDEPENDENT_AMBULATORY_CARE_PROVIDER_SITE_OTHER): Payer: 59 | Admitting: Gynecology

## 2015-03-17 VITALS — BP 124/80 | Ht 59.0 in | Wt 182.0 lb

## 2015-03-17 DIAGNOSIS — Z01419 Encounter for gynecological examination (general) (routine) without abnormal findings: Secondary | ICD-10-CM

## 2015-03-17 DIAGNOSIS — L309 Dermatitis, unspecified: Secondary | ICD-10-CM | POA: Diagnosis not present

## 2015-03-17 LAB — URINALYSIS W MICROSCOPIC + REFLEX CULTURE
Bilirubin Urine: NEGATIVE
Casts: NONE SEEN
GLUCOSE, UA: NEGATIVE mg/dL
Leukocytes, UA: NEGATIVE
Nitrite: NEGATIVE
Specific Gravity, Urine: >1.03 — ABNORMAL HIGH (ref 1.005–1.030)
Urobilinogen, UA: 0.2 mg/dL (ref 0.0–1.0)
pH: 5.5 (ref 5.0–8.0)

## 2015-03-17 MED ORDER — ACYCLOVIR 5 % EX CREA: 1.0000 "application " | TOPICAL_CREAM | CUTANEOUS | Status: DC

## 2015-03-17 MED ORDER — NITROFURANTOIN MONOHYD MACRO 100 MG PO CAPS: ORAL_CAPSULE | ORAL | Status: DC

## 2015-03-17 MED ORDER — ACYCLOVIR 5 % EX CREA: TOPICAL_CREAM | CUTANEOUS | Status: DC

## 2015-03-17 MED ORDER — LEVONORGESTREL-ETHINYL ESTRAD 0.1-20 MG-MCG PO TABS: 1.0000 | ORAL_TABLET | Freq: Every day | ORAL | Status: DC

## 2015-03-17 NOTE — Addendum Note (Signed)
Addended by: Kem ParkinsonBARNES, Bernisha Verma on: 03/17/2015 09:19 AM   Modules accepted: Orders

## 2015-03-17 NOTE — Progress Notes (Signed)
Destiny Rios [birth year] [id number]   History:    44 y.o.  for annual gyn exam who was complaining only of nonspecific dermatitis on her right cheek that she tried topical antibody with minimal response. Patient on avian 28 day oral contraception pill but appears to have issues with compliance and forgets to take at time and has breakthrough bleeding. 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 Patient has not seen her PCP and is currently fasting and would like to have her blood work drawn here. Patient with no past history of any abnormal Pap smears.  Past medical history,surgical history, family history and social history were all reviewed and documented in the EPIC chart.  Gynecologic History Patient's last menstrual period was 02/15/2015. Contraception: OCP (estrogen/progesterone) Last Pap: 2014. Results were: normal Last mammogram: 2015. Results were: normal  Obstetric History OB History  Gravida Para Term Preterm AB SAB TAB Ectopic Multiple Living  # Outcome Date GA Lbr Len/2nd Weight Sex Delivery Anes PTL Lv  2 Term     F Vag-Spont  N Y  1 Term     M Vag-Spont  N Y       ROS: A ROS was performed and pertinent positives and negatives are included in the history.  GENERAL: No fevers or chills. HEENT: No change in vision, no earache, sore throat or sinus congestion. NECK: No pain or stiffness. CARDIOVASCULAR: No chest pain or pressure. No palpitations. PULMONARY: No shortness of breath, cough or wheeze. GASTROINTESTINAL: No abdominal pain, nausea, vomiting or diarrhea, melena or bright red blood per rectum. GENITOURINARY: No urinary frequency, urgency, hesitancy or dysuria. MUSCULOSKELETAL: No joint or muscle pain, no back pain, no recent trauma. DERMATOLOGIC: No rash, no itching, no lesions. ENDOCRINE: No polyuria, polydipsia, no heat or cold intolerance. No recent  change in weight. HEMATOLOGICAL: No anemia or easy bruising or bleeding. NEUROLOGIC: No headache, seizures, numbness, tingling or weakness. PSYCHIATRIC: No depression, no loss of interest in normal activity or change in sleep pattern.     Exam: chaperone present  BP 124/80 mmHg  Ht  (1.499 m)  Wt 182 lb (82.555 kg)  BMI 36.74 kg/m2  LMP 02/15/2015  Body mass index is 36.74 kg/(m^2).  General appearance : Well developed well nourished female. No acute distress HEENT: Eyes: no retinal hemorrhage or exudates,  Neck supple, trachea midline, no carotid bruits, no thyroidmegaly Lungs: Clear to auscultation, no rhonchi or wheezes, or rib retractions  Heart: Regular rate and rhythm, no murmurs or gallops Breast:Examined in sitting and supine position were symmetrical in appearance, no palpable masses or tenderness,  no skin retraction, no nipple inversion, no nipple discharge, no skin discoloration, no axillary or supraclavicular lymphadenopathy Abdomen: no palpable masses or tenderness, no rebound or guarding Extremities: no edema or skin discoloration or tenderness  Pelvic:  Bartholin, Urethra, Skene Glands: Within normal limits             Vagina: No gross lesions or discharge  Cervix: No gross lesions or discharge  Uterus  anteverted, normal size, shape and consistency, non-tender and mobile  Adnexa  Without masses or tenderness  Anus and perineum  normal   Rectovaginal  normal sphincter tone without palpated masses or tenderness             Hemoccult not indicated     Assessment/Plan:  44 y.o.  female for annual exam for which information was provided on the Mirena IUD. We also discussed exercise and cholesterol lowering diet handout provided. Since she is fasting the following screening blood work was ordered: Fasting lipid profile, comprehensive metabolic panel, TSH, CBC, and urinalysis. She was reminded to schedule her mammogram at the end of the year. For her nonspecific  dermatitis we are going to try acyclovir cream to apply 3 times a day to her maxillary skin area for the next week to 10 days if no improvement we will refer to dermatologist. Pap smear not done today in accordance to the new guidelines.  Ok EdwardsFERNANDEZ,Bretton Tandy H MD, 8:44 AM 03/17/2015

## 2015-03-17 NOTE — Patient Instructions (Signed)
Exercise to Lose Weight Exercise and a healthy diet may help you lose weight. Your doctor may suggest specific exercises. EXERCISE IDEAS AND TIPS  Choose low-cost things you enjoy doing, such as walking, bicycling, or exercising to workout videos.   Take stairs instead of the elevator.   Walk during your lunch break.   Park your car further away from work or school.   Go to a gym or an exercise class.   Start with 5 to 10 minutes of exercise each day. Build up to 30 minutes of exercise 4 to 6 days a week.   Wear shoes with good support and comfortable clothes.   Stretch before and after working out.   Work out until you breathe harder and your heart beats faster.   Drink extra water when you exercise.   Do not do so much that you hurt yourself, feel dizzy, or get very short of breath.  Exercises that burn about 150 calories:  Running 1  miles in 15 minutes.   Playing volleyball for 45 to 60 minutes.   Washing and waxing a car for 45 to 60 minutes.   Playing touch football for 45 minutes.   Walking 1  miles in 35 minutes.   Pushing a stroller 1  miles in 30 minutes.   Playing basketball for 30 minutes.   Raking leaves for 30 minutes.   Bicycling 5 miles in 30 minutes.   Walking 2 miles in 30 minutes.   Dancing for 30 minutes.   Shoveling snow for 15 minutes.   Swimming laps for 20 minutes.   Walking up stairs for 15 minutes.   Bicycling 4 miles in 15 minutes.   Gardening for 30 to 45 minutes.   Jumping rope for 15 minutes.   Washing windows or floors for 45 to 60 minutes.  Document Released: 09/21/2010 Document Revised: 05/01/2011 Document Reviewed: 09/21/2010 [facility] Patient Information 2012 [place].                                          Patient information: High cholesterol (The Basics)  What is cholesterol? - Cholesterol is a substance that is found in the blood. Everyone has some. It is needed for good health. The problem is, people  sometimes have too much cholesterol. Compared with people with normal cholesterol, people with high cholesterol have a higher risk of heart attacks, strokes, and other health problems. The higher your cholesterol, the higher your risk of these problems. Cholesterol levels in your body are determined significantly by your diet. Cholesterol levels may also be related to heart disease. The following material helps to explain this relationship and discusses what you can do to help keep your heart healthy. Not all cholesterol is bad. Low-density lipoprotein (LDL) cholesterol is the "bad" cholesterol. It may cause fatty deposits to build up inside your arteries. High-density lipoprotein (HDL) cholesterol is "good." It helps to remove the "bad" LDL cholesterol from your blood. Cholesterol is a very important risk factor for heart disease. Other risk factors are high blood pressure, smoking, stress, heredity, and weight.  The heart muscle gets its supply of blood through the coronary arteries. If your LDL cholesterol is high and your HDL cholesterol is low, you are at risk for having fatty deposits build up in your coronary arteries. This leaves less room through which blood can flow. Without sufficient blood and  oxygen, the heart muscle cannot function properly and you may feel chest pains (angina pectoris). When a coronary artery closes up entirely, a part of the heart muscle may die, causing a heart attack (myocardial infarction).  CHECKING CHOLESTEROL When your caregiver sends your blood to a lab to be analyzed for cholesterol, a complete lipid (fat) profile may be done. With this test, the total amount of cholesterol and levels of LDL and HDL are determined. Triglycerides are a type of fat that circulates in the blood and can also be used to determine heart disease risk. Are there different types of cholesterol? - Yes, there are a few different types. If you get a cholesterol test, you may hear your doctor or  nurse talk about: Total cholesterol  LDL cholesterol - Some people call this the "bad" cholesterol. That's because having high LDL levels raises your risk of heart attacks, strokes, and other health problems.  HDL cholesterol - Some people call this the "good" cholesterol. That's because having high HDL levels lowers your risk of heart attacks, strokes, and other health problems.  Non-HDL cholesterol - Non-HDL cholesterol is your total cholesterol minus your HDL cholesterol.  Triglycerides - Triglycerides are not cholesterol. They are a type of fat. But they often get measured when cholesterol is measured. (Having high triglycerides also seems to increase the risk of heart attacks and strokes.)   Keep in mind, though, that many people who cannot meet these goals still have a low risk of heart attacks and strokes. What should I do if my doctor tells me I have high cholesterol? - Ask your doctor what your overall risk of heart attacks and strokes is. High cholesterol, by itself, is not always a reason to worry. Having high cholesterol is just one of many things that can increase your risk of heart attacks and strokes. Other factors that increase your risk include:  Cigarette smoking  High blood pressure  Having a parent, sister, or brother who got heart disease at a young age (Young, in this case, means younger than 55 for men and younger than 65 for women.)  Being a man (Women are at risk, too, but men have a higher risk.)  Older age  If you are at high risk of heart attacks and strokes, having high cholesterol is a problem. On the other hand, if you have are at low risk, having high cholesterol may not mean much. Should I take medicine to lower cholesterol? - Not everyone who has high cholesterol needs medicines. Your doctor or nurse will decide if you need them based on your age, family history, and other health concerns.  You should probably take a cholesterol-lowering medicine called a statin if  you: Already had a heart attack or stroke  Have known heart disease  Have diabetes  Have a condition called peripheral artery disease, which makes it painful to walk, and happens when the arteries in your legs get clogged with fatty deposits  Have an abdominal aortic aneurysm, which is a widening of the main artery in the belly  Most people with any of the conditions listed above should take a statin no matter what their cholesterol level is. If your doctor or nurse puts you on a statin, stay on it. The medicine may not make you feel any different. But it can help prevent heart attacks, strokes, and death.  Can I lower my cholesterol without medicines? - Yes, you can lower your cholesterol some by:  Avoiding red meat, butter,   fried foods, cheese, and other foods that have a lot of saturated fat  Losing weight (if you are overweight)  Being more active Even if these steps do little to change your cholesterol, they can improve your health in many ways.                                                   Cholesterol Control Diet  CONTROLLING CHOLESTEROL WITH DIET Although exercise and lifestyle factors are important, your diet is key. That is because certain foods are known to raise cholesterol and others to lower it. The goal is to balance foods for their effect on cholesterol and more importantly, to replace saturated and trans fat with other types of fat, such as monounsaturated fat, polyunsaturated fat, and omega-3 fatty acids. On average, a person should consume no more than 15 to 17 g of saturated fat daily. Saturated and trans fats are considered "bad" fats, and they will raise LDL cholesterol. Saturated fats are primarily found in animal products such as meats, butter, and cream. However, that does not mean you need to sacrifice all your favorite foods. Today, there are good tasting, low-fat, low-cholesterol substitutes for most of the things you like to eat. Choose low-fat or nonfat  alternatives. Choose round or loin cuts of red meat, since these types of cuts are lowest in fat and cholesterol. Chicken (without the skin), fish, veal, and ground [place] breast are excellent choices. Eliminate fatty meats, such as hot dogs and salami. Even shellfish have little or no saturated fat. Have a 3 oz (85 g) portion when you eat lean meat, poultry, or fish. Trans fats are also called "partially hydrogenated oils." They are oils that have been scientifically manipulated so that they are solid at room temperature resulting in a longer shelf life and improved taste and texture of foods in which they are added. Trans fats are found in stick margarine, some tub margarines, cookies, crackers, and baked goods.  When baking and cooking, oils are an excellent substitute for butter. The monounsaturated oils are especially beneficial since it is believed they lower LDL and raise HDL. The oils you should avoid entirely are saturated tropical oils, such as coconut and palm.  Remember to eat liberally from food groups that are naturally free of saturated and trans fat, including fish, fruit, vegetables, beans, grains (barley, rice, couscous, bulgur wheat), and pasta (without cream sauces).  IDENTIFYING FOODS THAT LOWER CHOLESTEROL  Soluble fiber may lower your cholesterol. This type of fiber is found in fruits such as apples, vegetables such as broccoli, potatoes, and carrots, legumes such as beans, peas, and lentils, and grains such as barley. Foods fortified with plant sterols (phytosterol) may also lower cholesterol. You should eat at least 2 g per day of these foods for a cholesterol lowering effect.  Read package labels to identify low-saturated fats, trans fats free, and low-fat foods at the supermarket. Select cheeses that have only 2 to 3 g saturated fat per ounce. Use a heart-healthy tub margarine that is free of trans fats or partially hydrogenated oil. When buying baked goods (cookies, crackers), avoid  partially hydrogenated oils. Breads and muffins should be made from whole grains (whole-wheat or whole oat flour, instead of "flour" or "enriched flour"). Buy non-creamy canned soups with reduced salt and no added fats.  FOOD PREPARATION TECHNIQUES  Never deep-fry. If you must fry, either stir-fry, which uses very little fat, or use non-stick cooking sprays. When possible, broil, bake, or roast meats, and steam vegetables. Instead of dressing vegetables with butter or margarine, use lemon and herbs, applesauce and cinnamon (for squash and sweet potatoes), nonfat yogurt, salsa, and low-fat dressings for salads.  LOW-SATURATED FAT / LOW-FAT FOOD SUBSTITUTES Meats / Saturated Fat (g)  Avoid: Steak, marbled (3 oz/85 g) / 11 g   Choose: Steak, lean (3 oz/85 g) / 4 g   Avoid: Hamburger (3 oz/85 g) / 7 g   Choose: Hamburger, lean (3 oz/85 g) / 5 g   Avoid: Ham (3 oz/85 g) / 6 g   Choose: Ham, lean cut (3 oz/85 g) / 2.4 g   Avoid: Chicken, with skin, dark meat (3 oz/85 g) / 4 g   Choose: Chicken, skin removed, dark meat (3 oz/85 g) / 2 g   Avoid: Chicken, with skin, light meat (3 oz/85 g) / 2.5 g   Choose: Chicken, skin removed, light meat (3 oz/85 g) / 1 g  Dairy / Saturated Fat (g)  Avoid: Whole milk (1 cup) / 5 g   Choose: Low-fat milk, 2% (1 cup) / 3 g   Choose: Low-fat milk, 1% (1 cup) / 1.5 g   Choose: Skim milk (1 cup) / 0.3 g   Avoid: Hard cheese (1 oz/28 g) / 6 g   Choose: Skim milk cheese (1 oz/28 g) / 2 to 3 g   Avoid: Cottage cheese, 4% fat (1 cup) / 6.5 g   Choose: Low-fat cottage cheese, 1% fat (1 cup) / 1.5 g   Avoid: Ice cream (1 cup) / 9 g   Choose: Sherbet (1 cup) / 2.5 g   Choose: Nonfat frozen yogurt (1 cup) / 0.3 g   Choose: Frozen fruit bar / trace   Avoid: Whipped cream (1 tbs) / 3.5 g   Choose: Nondairy whipped topping (1 tbs) / 1 g  Condiments / Saturated Fat (g)  Avoid: Mayonnaise (1 tbs) / 2 g   Choose: Low-fat mayonnaise (1 tbs) / 1 g    Avoid: Butter (1 tbs) / 7 g   Choose: Extra light margarine (1 tbs) / 1 g   Avoid: Coconut oil (1 tbs) / 11.8 g   Choose: Olive oil (1 tbs) / 1.8 g   Choose: Corn oil (1 tbs) / 1.7 g   Choose: Safflower oil (1 tbs) / 1.2 g   Choose: Sunflower oil (1 tbs) / 1.4 g   Choose: Soybean oil (1 tbs) / 2.4 g   Choose: Canola oil (1 tbs) / 1 g  Levonorgestrel intrauterine device (IUD) What is this medicine? LEVONORGESTREL IUD (LEE voe nor jes trel) is a contraceptive (birth control) device. The device is placed inside the uterus by a healthcare professional. It is used to prevent pregnancy and can also be used to treat heavy bleeding that occurs during your period. Depending on the device, it can be used for 3 to 5 years. This medicine may be used for other purposes; ask your health care provider or pharmacist if you have questions. COMMON BRAND NAME(S): Elveria Royals What should I tell my health care provider before I take this medicine? They need to know if you have any of these conditions: -abnormal Pap smear -cancer of the breast, uterus, or cervix -diabetes -endometritis -genital or pelvic infection now or in the past -have more than one sexual  partner or your partner has more than one partner -heart disease -history of an ectopic or tubal pregnancy -immune system problems -IUD in place -liver disease or tumor -problems with blood clots or take blood-thinners -use intravenous drugs -uterus of unusual shape -vaginal bleeding that has not been explained -an unusual or allergic reaction to levonorgestrel, other hormones, silicone, or polyethylene, medicines, foods, dyes, or preservatives -pregnant or trying to get pregnant -breast-feeding How should I use this medicine? This device is placed inside the uterus by a health care professional. Talk to your pediatrician regarding the use of this medicine in children. Special care may be needed. Overdosage: If you think  you have taken too much of this medicine contact a poison control center or emergency room at once. NOTE: This medicine is only for you. Do not share this medicine with others. What if I miss a dose? This does not apply. What may interact with this medicine? Do not take this medicine with any of the following medications: -amprenavir -bosentan -fosamprenavir This medicine may also interact with the following medications: -aprepitant -barbiturate medicines for inducing sleep or treating seizures -bexarotene -griseofulvin -medicines to treat seizures like carbamazepine, ethotoin, felbamate, oxcarbazepine, phenytoin, topiramate -modafinil -pioglitazone -rifabutin -rifampin -rifapentine -some medicines to treat HIV infection like atazanavir, indinavir, lopinavir, nelfinavir, tipranavir, ritonavir -St. John's wort -warfarin This list may not describe all possible interactions. Give your health care provider a list of all the medicines, herbs, non-prescription drugs, or dietary supplements you use. Also tell them if you smoke, drink alcohol, or use illegal drugs. Some items may interact with your medicine. What should I watch for while using this medicine? Visit your doctor or health care professional for regular check ups. See your doctor if you or your partner has sexual contact with others, becomes HIV positive, or gets a sexual transmitted disease. This product does not protect you against HIV infection (AIDS) or other sexually transmitted diseases. You can check the placement of the IUD yourself by reaching up to the top of your vagina with clean fingers to feel the threads. Do not pull on the threads. It is a good habit to check placement after each menstrual period. Call your doctor right away if you feel more of the IUD than just the threads or if you cannot feel the threads at all. The IUD may come out by itself. You may become pregnant if the device comes out. If you notice that the  IUD has come out use a backup birth control method like condoms and call your health care provider. Using tampons will not change the position of the IUD and are okay to use during your period. What side effects may I notice from receiving this medicine? Side effects that you should report to your doctor or health care professional as soon as possible: -allergic reactions like skin rash, itching or hives, swelling of the face, lips, or tongue -fever, flu-like symptoms -genital sores -high blood pressure -no menstrual period for 6 weeks during use -pain, swelling, warmth in the leg -pelvic pain or tenderness -severe or sudden headache -signs of pregnancy -stomach cramping -sudden shortness of breath -trouble with balance, talking, or walking -unusual vaginal bleeding, discharge -yellowing of the eyes or skin Side effects that usually do not require medical attention (report to your doctor or health care professional if they continue or are bothersome): -acne -breast pain -change in sex drive or performance -changes in weight -cramping, dizziness, or faintness while the device is being inserted -headache -  irregular menstrual bleeding within first 3 to 6 months of use -nausea This list may not describe all possible side effects. Call your doctor for medical advice about side effects. You may report side effects to FDA at 1-800-FDA-1088. Where should I keep my medicine? This does not apply. NOTE: This sheet is a summary. It may not cover all possible information. If you have questions about this medicine, talk to your doctor, pharmacist, or health care provider.  2015, [employer]. (2011-09-19 13:54:04)

## 2015-03-19 LAB — URINE CULTURE
Colony Count: NO GROWTH
ORGANISM ID, BACTERIA: NO GROWTH

## 2015-03-29 ENCOUNTER — Telehealth: Payer: Self-pay

## 2015-03-29 MED ORDER — CLOBETASOL PROPIONATE 0.05 % EX CREA: TOPICAL_CREAM | CUTANEOUS | Status: DC

## 2015-03-29 NOTE — Telephone Encounter (Signed)
Call in prescription for clobetasol 0.05% to apply twice a day for 7-10 days

## 2015-03-29 NOTE — Telephone Encounter (Signed)
New Rx sent to pharmacy

## 2015-03-29 NOTE — Telephone Encounter (Signed)
Pharmacy faxed a note to you in regards to new Rx for Zovirax 5% Cream stating " Patient says too expensive. Not covered by her insurance and is almost $300."

## 2015-08-15 ENCOUNTER — Encounter: Payer: Self-pay | Admitting: Gynecology

## 2015-11-20 DIAGNOSIS — G43909 Migraine, unspecified, not intractable, without status migrainosus: Secondary | ICD-10-CM | POA: Insufficient documentation

## 2015-12-18 ENCOUNTER — Ambulatory Visit (INDEPENDENT_AMBULATORY_CARE_PROVIDER_SITE_OTHER): Payer: 59 | Admitting: Gynecology

## 2015-12-18 ENCOUNTER — Encounter: Payer: Self-pay | Admitting: Gynecology

## 2015-12-18 ENCOUNTER — Telehealth: Payer: Self-pay | Admitting: *Deleted

## 2015-12-18 VITALS — BP 126/82

## 2015-12-18 DIAGNOSIS — R300 Dysuria: Secondary | ICD-10-CM

## 2015-12-18 DIAGNOSIS — L659 Nonscarring hair loss, unspecified: Secondary | ICD-10-CM

## 2015-12-18 DIAGNOSIS — N3001 Acute cystitis with hematuria: Secondary | ICD-10-CM | POA: Diagnosis not present

## 2015-12-18 LAB — TSH: TSH: 1.03 m[IU]/L

## 2015-12-18 LAB — URINALYSIS W MICROSCOPIC + REFLEX CULTURE
Bilirubin Urine: NEGATIVE
CASTS: NONE SEEN [LPF]
CRYSTALS: NONE SEEN [HPF]
Glucose, UA: NEGATIVE
Ketones, ur: NEGATIVE
Nitrite: NEGATIVE
PROTEIN: NEGATIVE
Specific Gravity, Urine: 1.02 (ref 1.001–1.035)
Yeast: NONE SEEN [HPF]
pH: 5.5 (ref 5.0–8.0)

## 2015-12-18 MED ORDER — CIPROFLOXACIN HCL 250 MG PO TABS: 250.0000 mg | ORAL_TABLET | Freq: Two times a day (BID) | ORAL | Status: DC

## 2015-12-18 MED ORDER — PHENAZOPYRIDINE HCL 200 MG PO TABS: 200.0000 mg | ORAL_TABLET | Freq: Three times a day (TID) | ORAL | Status: DC | PRN

## 2015-12-18 NOTE — Patient Instructions (Signed)
Phenazopyridine tablets  What is this medicine?  PHENAZOPYRIDINE (fen az oh PEER i deen) is a pain reliever. It is used to stop the pain, burning, or discomfort caused by infection or irritation of the urinary tract. This medicine is not an antibiotic. It will not cure a urinary tract infection.  This medicine may be used for other purposes; ask your health care provider or pharmacist if you have questions.  What should I tell my health care provider before I take this medicine?  They need to know if you have any of these conditions:  -glucose-6-phosphate dehydrogenase (G6PD) deficiency  -kidney disease  -an unusual or allergic reaction to phenazopyridine, other medicines, foods, dyes, or preservatives  -pregnant or trying to get pregnant  -breast-feeding  How should I use this medicine?  Take this medicine by mouth with a glass of water. Follow the directions on the prescription label. Take after meals. Take your doses at regular intervals. Do not take your medicine more often than directed. Do not skip doses or stop your medicine early even if you feel better. Do not stop taking except on your doctor's advice.  Talk to your pediatrician regarding the use of this medicine in children. Special care may be needed.  Overdosage: If you think you have taken too much of this medicine contact a poison control center or emergency room at once.  NOTE: This medicine is only for you. Do not share this medicine with others.  What if I miss a dose?  If you miss a dose, take it as soon as you can. If it is almost time for your next dose, take only that dose. Do not take double or extra doses.  What may interact with this medicine?  Interactions are not expected.  This list may not describe all possible interactions. Give your health care provider a list of all the medicines, herbs, non-prescription drugs, or dietary supplements you use. Also tell them if you smoke, drink alcohol, or use illegal drugs. Some items may interact  with your medicine.  What should I watch for while using this medicine?  Tell your doctor or health care professional if your symptoms do not improve or if they get worse.  This medicine colors body fluids red. This effect is harmless and will go away after you are done taking the medicine. It will change urine to an dark orange or red color. The red color may stain clothing. Soft contact lenses may become permanently stained. It is best not to wear soft contact lenses while taking this medicine.  If you are diabetic you may get a false positive result for sugar in your urine. Talk to your health care provider.  What side effects may I notice from receiving this medicine?  Side effects that you should report to your doctor or health care professional as soon as possible:  -allergic reactions like skin rash, itching or hives, swelling of the face, lips, or tongue  -blue or purple color of the skin  -difficulty breathing  -fever  -less urine  -unusual bleeding, bruising  -unusual tired, weak  -vomiting  -yellowing of the eyes or skin  Side effects that usually do not require medical attention (report to your doctor or health care professional if they continue or are bothersome):  -dark urine  -headache  -stomach upset  This list may not describe all possible side effects. Call your doctor for medical advice about side effects. You may report side effects to FDA at 1-800-FDA-1088.    expiration date. NOTE: This sheet is a summary. It may not cover all possible information. If you have questions about this medicine, talk to your doctor, pharmacist, or health care provider.    2016, Elsevier/Gold Standard. (2008-03-17 11:04:07) Ciprofloxacin extended-release tablets What is this  medicine? CIPROFLOXACIN (sip roe FLOX a sin) is a quinolone antibiotic. It is used to treat certain kinds of bacterial infections. It will not work for colds, flu, or other viral infections. This medicine may be used for other purposes; ask your health care provider or pharmacist if you have questions. What should I tell my health care provider before I take this medicine? They need to know if you have any of these conditions: -bone problems -cerebral disease -history of low potassium levels in the blood -irregular heartbeat -joint problems -kidney disease -myasthenia gravis -seizures -tendon problems -tingling of the fingers or toes, or other nerve disorder -an unusual or allergic reaction to ciprofloxacin, other antibiotics or medicines, foods, dyes, or preservatives -pregnant or trying to get pregnant -breast-feeding How should I use this medicine? Take this medicine by mouth with a full glass of water. Follow the directions on the prescription label. Do not split, crush, or chew the tablet. Take your medicine at regular intervals. Do not take your medicine more often than directed. Take all of your medicine as directed even if you think your are better. Do not skip doses or stop your medicine early. You can take this medicine with food or on an empty stomach. It can be taken with a meal that contains dairy or calcium, but do not take it alone with a dairy product, like milk or yogurt, or calcium-fortified juice. A special MedGuide will be given to you by the pharmacist with each prescription and refill. Be sure to read this information carefully each time. Talk to your pediatrician regarding the use of this medicine in children. Special care may be needed. Overdosage: If you think you have taken too much of this medicine contact a poison control center or emergency room at once. NOTE: This medicine is only for you. Do not share this medicine with others. What if I miss a dose? If you  miss a dose, take it as soon as you can. If it is almost time for your next dose, take only that dose. Do not take double or extra doses. Do not take more than one dose in a day. What may interact with this medicine? Do not take this medicine with any of the following medications: -cisapride -droperidol -terfenadine -tizanidine This medicine may also interact with the following medications: -antacids -birth control pills -caffeine -cyclosporin -didanosine (ddI) buffered tablets or powder -medicines for diabetes -medicines for inflammation like ibuprofen, naproxen -methotrexate -multivitamins -omeprazole -phenytoin -probenecid -sucralfate -theophylline -warfarin This list may not describe all possible interactions. Give your health care provider a list of all the medicines, herbs, non-prescription drugs, or dietary supplements you use. Also tell them if you smoke, drink alcohol, or use illegal drugs. Some items may interact with your medicine. What should I watch for while using this medicine? Tell your doctor or health care professional if your symptoms do not improve. Do not treat diarrhea with over the counter products. Contact your doctor if you have diarrhea that lasts more than 2 days or if it is severe and watery. You may get drowsy or dizzy. Do not drive, use machinery, or do anything that needs mental alertness until you know how this medicine affects you. Do not stand or   sit up quickly, especially if you are an older patient. This reduces the risk of dizzy or fainting spells. This medicine can make you more sensitive to the sun. Keep out of the sun. If you cannot avoid being in the sun, wear protective clothing and use sunscreen. Do not use sun lamps or tanning beds/booths. Avoid antacids, aluminum, calcium, iron, magnesium, and zinc products for 6 hours before and 2 hours after taking a dose of this medicine. What side effects may I notice from receiving this medicine? Side  effects that you should report to your doctor or health care professional as soon as possible: -allergic reactions like skin rash or hives, swelling of the face, lips, or tongue -anxious -confusion -depressed mood -diarrhea -fast, irregular heartbeat -hallucination, loss of contact with reality -joint, muscle, or tendon pain or swelling -pain, tingling, numbness in the hands or feet -suicidal thoughts or other mood changes -sunburn -unusually weak or tired Side effects that usually do not require medical attention (report to your doctor or health care professional if they continue or are bothersome): -dry mouth -headache -nausea -trouble sleeping This list may not describe all possible side effects. Call your doctor for medical advice about side effects. You may report side effects to FDA at 1-800-FDA-1088. Where should I keep my medicine? Keep out of the reach of children. Store at room temperature between 15 to 30 degrees C (59 to 86 degrees F). Keep container tightly closed. Throw away any unused medicine after the expiration date. NOTE: This sheet is a summary. It may not cover all possible information. If you have questions about this medicine, talk to your doctor, pharmacist, or health care provider.    2016, Elsevier/Gold Standard. (2015-03-30 12:49:29) Urinary Tract Infection Urinary tract infections (UTIs) can develop anywhere along your urinary tract. Your urinary tract is your body's drainage system for removing wastes and extra water. Your urinary tract includes two kidneys, two ureters, a bladder, and a urethra. Your kidneys are a pair of bean-shaped organs. Each kidney is about the size of your fist. They are located below your ribs, one on each side of your spine. CAUSES Infections are caused by microbes, which are microscopic organisms, including fungi, viruses, and bacteria. These organisms are so small that they can only be seen through a microscope. Bacteria are the  microbes that most commonly cause UTIs. SYMPTOMS  Symptoms of UTIs may vary by age and gender of the patient and by the location of the infection. Symptoms in young women typically include a frequent and intense urge to urinate and a painful, burning feeling in the bladder or urethra during urination. Older women and men are more likely to be tired, shaky, and weak and have muscle aches and abdominal pain. A fever may mean the infection is in your kidneys. Other symptoms of a kidney infection include pain in your back or sides below the ribs, nausea, and vomiting. DIAGNOSIS To diagnose a UTI, your caregiver will ask you about your symptoms. Your caregiver will also ask you to provide a urine sample. The urine sample will be tested for bacteria and white blood cells. White blood cells are made by your body to help fight infection. TREATMENT  Typically, UTIs can be treated with medication. Because most UTIs are caused by a bacterial infection, they usually can be treated with the use of antibiotics. The choice of antibiotic and length of treatment depend on your symptoms and the type of bacteria causing your infection. HOME CARE INSTRUCTIONS  If you were prescribed antibiotics, take them exactly as your caregiver instructs you. Finish the medication even if you feel better after you have only taken some of the medication.  Drink enough water and fluids to keep your urine clear or pale yellow.  Avoid caffeine, tea, and carbonated beverages. They tend to irritate your bladder.  Empty your bladder often. Avoid holding urine for long periods of time.  Empty your bladder before and after sexual intercourse.  After a bowel movement, women should cleanse from front to back. Use each tissue only once. SEEK MEDICAL CARE IF:   You have back pain.  You develop a fever.  Your symptoms do not begin to resolve within 3 days. SEEK IMMEDIATE MEDICAL CARE IF:   You have severe back pain or lower abdominal  pain.  You develop chills.  You have nausea or vomiting.  You have continued burning or discomfort with urination. MAKE SURE YOU:   Understand these instructions.  Will watch your condition.  Will get help right away if you are not doing well or get worse.   This information is not intended to replace advice given to you by your health care provider. Make sure you discuss any questions you have with your health care provider.   Document Released: 05/29/2005 Document Revised: 05/10/2015 Document Reviewed: 09/27/2011 Elsevier Interactive Patient Education [employer].

## 2015-12-18 NOTE — Telephone Encounter (Signed)
Call in Diflucan 150 mg one po prn

## 2015-12-18 NOTE — Telephone Encounter (Signed)
Pt was seen today treated for UTI she mention to you about for Rx for Diflucan to have on hand incase for yeast infection after finishing cipro 250 mg. Please advise

## 2015-12-18 NOTE — Progress Notes (Signed)
   Patient is a 45 year old who presented to the office today with several days history of urinary frequency and dysuria no vaginal discharge. Some mild back discomfort but denies any nausea, vomiting, fever or chills. Patient is on oral contraceptive pills for contraception.  Exam: Back no CVA tenderness Abdomen: Some mild suprapubic tenderness  Urinalysis: White blood cells 20-40 Red blood cells: 40-60 Bacteria: Moderate  Patient been complaining of gray hairs appearing and also thinning and loss of hair for this reason we will check her TSH today.  Assessment/plan: Urinary tract infection will be treated with Cipro 250 mg one by mouth 3 times a day for 3 days. Of note patient several years ago had a urinary tract infection with Klebsiella and this is the reason this antibiotic was chosen based on past sensitivity. She'll also be prescribed Pyridium 200 mg take 1 by mouth 3 times a day for 3 days for bladder spasm. She was encouraged to increase her fluid intake. She develops any CVA tenderness, fever, chills, nausea, vomiting she is to report to the office immediately or after-hours to the emergency room. A TSH will be checked today because of her report of gray hair and thinning of her hair

## 2015-12-19 MED ORDER — FLUCONAZOLE 150 MG PO TABS: 150.0000 mg | ORAL_TABLET | Freq: Once | ORAL | Status: DC

## 2015-12-19 NOTE — Telephone Encounter (Signed)
Pt aware, Rx sent. 

## 2015-12-21 LAB — URINE CULTURE

## 2016-02-27 ENCOUNTER — Other Ambulatory Visit: Payer: Self-pay | Admitting: Gynecology

## 2016-02-27 ENCOUNTER — Other Ambulatory Visit: Payer: Self-pay

## 2016-02-27 MED ORDER — LEVONORGESTREL-ETHINYL ESTRAD 0.1-20 MG-MCG PO TABS: 1.0000 | ORAL_TABLET | Freq: Every day | ORAL | Status: DC

## 2016-03-18 ENCOUNTER — Encounter: Payer: 59 | Admitting: Gynecology

## 2016-03-21 ENCOUNTER — Encounter: Payer: 59 | Admitting: Gynecology

## 2016-03-26 ENCOUNTER — Other Ambulatory Visit: Payer: Self-pay | Admitting: Gynecology

## 2016-04-04 ENCOUNTER — Ambulatory Visit (INDEPENDENT_AMBULATORY_CARE_PROVIDER_SITE_OTHER): Payer: 59 | Admitting: Gynecology

## 2016-04-04 ENCOUNTER — Encounter: Payer: Self-pay | Admitting: Gynecology

## 2016-04-04 VITALS — BP 126/88 | Ht 58.5 in | Wt 188.0 lb

## 2016-04-04 DIAGNOSIS — R635 Abnormal weight gain: Secondary | ICD-10-CM

## 2016-04-04 DIAGNOSIS — M62830 Muscle spasm of back: Secondary | ICD-10-CM | POA: Diagnosis not present

## 2016-04-04 DIAGNOSIS — Z01419 Encounter for gynecological examination (general) (routine) without abnormal findings: Secondary | ICD-10-CM | POA: Diagnosis not present

## 2016-04-04 DIAGNOSIS — I8393 Asymptomatic varicose veins of bilateral lower extremities: Secondary | ICD-10-CM

## 2016-04-04 LAB — CBC WITH DIFFERENTIAL/PLATELET
BASOS ABS: 0 {cells}/uL (ref 0–200)
Basophils Relative: 0 %
EOS ABS: 156 {cells}/uL (ref 15–500)
Eosinophils Relative: 2 %
HEMATOCRIT: 42.2 % (ref 35.0–45.0)
HEMOGLOBIN: 13.8 g/dL (ref 11.7–15.5)
LYMPHS ABS: 2340 {cells}/uL (ref 850–3900)
Lymphocytes Relative: 30 %
MCH: 30.7 pg (ref 27.0–33.0)
MCHC: 32.7 g/dL (ref 32.0–36.0)
MCV: 93.8 fL (ref 80.0–100.0)
MPV: 10.8 fL (ref 7.5–12.5)
Monocytes Absolute: 624 cells/uL (ref 200–950)
Monocytes Relative: 8 %
NEUTROS ABS: 4680 {cells}/uL (ref 1500–7800)
Neutrophils Relative %: 60 %
Platelets: 215 10*3/uL (ref 140–400)
RBC: 4.5 MIL/uL (ref 3.80–5.10)
RDW: 13.1 % (ref 11.0–15.0)
WBC: 7.8 10*3/uL (ref 3.8–10.8)

## 2016-04-04 LAB — COMPREHENSIVE METABOLIC PANEL
ALBUMIN: 3.9 g/dL (ref 3.6–5.1)
ALK PHOS: 64 U/L (ref 33–115)
ALT: 12 U/L (ref 6–29)
AST: 12 U/L (ref 10–30)
BILIRUBIN TOTAL: 0.4 mg/dL (ref 0.2–1.2)
BUN: 12 mg/dL (ref 7–25)
CHLORIDE: 106 mmol/L (ref 98–110)
CO2: 23 mmol/L (ref 20–31)
Calcium: 9.2 mg/dL (ref 8.6–10.2)
Creat: 0.67 mg/dL (ref 0.50–1.10)
GLUCOSE: 94 mg/dL (ref 65–99)
POTASSIUM: 4.1 mmol/L (ref 3.5–5.3)
Sodium: 140 mmol/L (ref 135–146)
TOTAL PROTEIN: 6.6 g/dL (ref 6.1–8.1)

## 2016-04-04 LAB — TSH: TSH: 0.58 m[IU]/L

## 2016-04-04 LAB — LIPID PANEL
Cholesterol: 155 mg/dL (ref 125–200)
HDL: 50 mg/dL (ref >=46)
LDL CALC: 84 mg/dL (ref <130)
Total CHOL/HDL Ratio: 3.1 Ratio (ref <=5.0)
Triglycerides: 103 mg/dL (ref <150)
VLDL: 21 mg/dL (ref <30)

## 2016-04-04 MED ORDER — LEVONORGESTREL-ETHINYL ESTRAD 0.1-20 MG-MCG PO TABS: 1.0000 | ORAL_TABLET | Freq: Every day | ORAL | 11 refills | Status: DC

## 2016-04-04 MED ORDER — CYCLOBENZAPRINE HCL 5 MG PO TABS: 5.0000 mg | ORAL_TABLET | Freq: Three times a day (TID) | ORAL | 0 refills | Status: DC | PRN

## 2016-04-04 MED ORDER — NITROFURANTOIN MONOHYD MACRO 100 MG PO CAPS: ORAL_CAPSULE | ORAL | 5 refills | Status: DC

## 2016-04-04 MED ORDER — CYCLOBENZAPRINE HCL 5 MG PO TABS: 5.0000 mg | ORAL_TABLET | Freq: Every day | ORAL | Status: DC

## 2016-04-04 NOTE — Patient Instructions (Addendum)
Control del colesterol  Los niveles de colesterol en el organismo estn determinados significativamente por su dieta. Los niveles de colesterol tambin se relacionan con la enfermedad cardaca. El material que sigue ayuda a explicar esta relacin y a analizar qu puede hacer para mantener su corazn sano. No todo el colesterol es malo. Las lipoprotenas de baja densidad (LDL) forman el colesterol "malo". El colesterol malo puede ocasionar depsitos de grasa que se acumulan en el interior de las arterias. Las lipoprotenas de alta densidad (HDL) es el colesterol "bueno". Ayuda a remover el colesterol LDL "malo" de la sangre. El colesterol es un factor de riesgo muy importante para la enfermedad cardaca. Otros factores de riesgo son la hipertensin arterial, el hbito de fumar, el estrs, la herencia y el peso.   El msculo cardaco obtiene el suministro de sangre a travs de las arterias coronarias. Si su colesterol LDL ("malo") est elevado y el HDL ("bueno") es bajo, tiene un factor de riesgo para que se formen depsitos de grasa en las arterias coronarias (los vasos sanguneos que suministran sangre al corazn). Esto hace que haya menos lugar para que la sangre circule. Sin la suficiente sangre y oxgeno, el msculo cardaco no puede funcionar correctamente, y usted podr sentir dolores en el pecho (angina pectoris). Cuando una arteria coronaria se cierra completamente, una parte del msculo cardaco puede morir (infarto de miocardio).  CONTROL DEL COLESTEROL Cuando el profesional que lo asiste enva la sangre al laboratorio para conocer el nivel de colesterol, puede realizarle tambin un perfil completo de los lpidos. Con esta prueba, se puede determinar la cantidad total de colesterol, as como los niveles de LDL y HDL. Los triglicridos son un tipo de grasa que circula en la sangre y que tambin puede utilizarse para determinar el riesgo de enfermedad  cardaca. En la siguiente tabla se establecen los nmeros ideales: Prueba: Colesterol total  Menos de 200 mg/dl.  Prueba: LDL "colesterol malo"  Menos de 100 mg/dl.   Menos de 70 mg/dl si tiene riesgo muy elevado de sufrir un ataque cardaco o muerte cardaca sbita.  Prueba: HDL "colesterol bueno"  Mujeres: Ms de 50 mg/dl.   Hombres: Ms de 40 mg/dl.  Prueba: Trigliceridos  Menos de 150 mg/dl.    CONTROL DEL COLESTEROL CON DIETA Aunque factores como el ejercicio y el estilo de vida son importantes, la "primera lnea de ataque" es la dieta. Esto se debe a que se sabe que ciertos alimentos hacen subir el colesterol y otros lo bajan. El objetivo debe ser equilibrar los alimentos, de modo que tengan un efecto sobre el colesterol y, an ms importante, reemplazar las grasas saturadas y trans con otros tipos de grasas, como las monoinsaturadas y las poliinsaturadas y cidos grasos omega-3 . En promedio, una persona no debe consumir ms de 15 a 17 g de grasas saturadas por da. Las grasas saturadas y trans se consideran grasas "malas", ya que elevan el colesterol LDL. Las grasas saturadas se encuentran principalmente en productos animales como carne, manteca y crema. Pero esto no significa que usted debe sacrificar todas sus comidas favoritas. Actualmente, como lo muestra el cuadro que figura al final de este documento, hay sustitutos de buen sabor, bajos en grasas y en colesterol, para la mayora de los alimentos que a usted le gusta comer. Elija aquellos alimentos alternativos que sean bajos en grasas o sin grasas. Elija cortes de carne del cuarto trasero o lomo ya que estos cortes son los que tienen menor cantidad de grasa   y colesterol. El pollo (sin piel), el pescado, la carne de ternera, y la pechuga de pavo molida son excelentes opciones. Elimine las carnes grasosas como los hotdogs o el salami. Los mariscos tienen poco o nada de grasas saturadas. Cuando consuma carne magra, carne de aves de  corral, o pescado, hgalo en porciones de 85 gramos (3 onzas). Las grasas trans tambin se llaman "aceites parcialmente hidrogenados". Son aceites manipulados cientficamente de modo que son slidos a temperatura ambiente, tienen una larga vida y mejoran el sabor y la textura de los alimentos a los que se agregan. Las grasas trans se encuentran en la margarina, masitas, crackers y alimentos horneados.  Para hornear y cocinar, el aceite es un excelente sustituto para la mantequilla. Los aceites monoinsaturados tienen un beneficio particular, ya que se cree que disminuyen el colesterol LDL (colesterol malo) y elevan el HDL. Deber evitar los aceites tropicales saturados como el de coco y el de palma.  Recuerde, adems, que puede comer sin restricciones los grupos de alimentos que son naturalmente libres de grasas saturadas y grasas trans, entre los que se incluyen el pescado, las frutas (excepto el aguacate), verduras, frijoles, cereales (cebada, arroz, cuzcuz, trigo) y las pastas (sin salsas con crema)   IDENTIFIQUE LOS ALIMENTOS QUE DISMINUYEN EL COLESTEROL  Pueden disminuir el colesterol las fibras solubles que estn en las frutas, como las manzanas, en los vegetales como el brcoli, las patatas y las zanahorias; en las legumbres como frijoles, guisantes y lentejas; y en los cereales como la cebada. Los alimentos fortificados con fitosteroles tambin pueden disminuir el colesterol. Debe consumir al menos 2 g de estos alimentos a diario para obtener el efecto de disminucin de colesterol.  En el supermercado, lea las etiquetas de los envases para identificar los alimentos bajos en grasas saturadas, libres de grasas trans y bajos en grasas, . Elija quesos que tengan solo de 2 a 3 g de grasa saturada por onza (28,35 g). Use una margarina que no dae el corazn, libre de grasas trans o aceite parcialmente hidrogenado. Al comprar alimentos horneados (galletitas dulces y galletas) evite el aceite parcialmente  hidrogenado. Los panes y bollos debern ser de granos enteros (harina de maz o de avena entera, en lugar de "harina" o "harina enriquecida"). Compre sopas en lata que no sean cremosas, con bajo contenido de sal y sin grasas adicionadas.   TCNICAS DE PREPARACIN DE LOS ALIMENTOS  Nunca fra los alimentos en aceite abundante. Si debe frer, hgalo en poco aceite y removiendo constantemente, porque as se utilizan muy pocas grasas, o utilice un spray antiadherente. Cuando le sea posible, hierva, hornee o ase las carnes y cocine los vegetales al vapor. En vez de aderezar los vegetales con mantequilla o margarina, utilice limn y hierbas, pur de manzanas y canela (para las calabazas y batatas), yogurt y salsa descremados y aderezos para ensaladas bajos en contenido graso.   BAJO EN GRASAS SATURADAS / SUSTITUTOS BAJOS EN GRASA  Carnes / Grasas saturadas (g)  Evite: Bife, corte graso (3 oz/85 g) / 11 g   Elija: Bife, corte magro (3 oz/85 g) / 4 g   Evite: Hamburguesa (3 oz/85 g) / 7 g   Elija:  [place] magra (3 oz/85 g) / 5 g   Evite: Jamn (3 oz/85 g) / 6 g   Elija:  Jamn magro (3 oz/85 g) / 2.4 g   Evite: Pollo, con piel (3 oz/85 g), Carne oscura / 4 g   Elija:  Pollo, sin piel (  3 oz/85 g), Carne oscura / 2 g   Evite: Pollo, con piel (3 oz/85 g), Carne magra / 2.5 g   Elija: Pollo, sin piel (3 oz/85 g), Carne magra / 1 g  Lcteos / Grasas saturadas (g)  Evite: Leche entera (1 taza) / 5 g   Elija: Leche con bajo contenido de grasa, 2% (1 taza) / 3 g   Elija: Leche con bajo contenido de grasa, 1% (1 taza) / 1.5 g   Elija: Leche descremada (1 taza) / 0.3 g   Evite: Queso duro (1 oz/28 g) / 6 g   Elija: Queso descremado (1 oz/28 g) / 2-3 g   Evite: Queso cottage, 4% grasa (1 taza)/ 6.5 g   Elija: Queso cottage con bajo contenido de grasa, 1% grasa (1 taza)/ 1.5 g   Evite: Helado (1 taza) / 9 g   Elija: Sorbete (1 taza) / 2.5 g   Elija: Yogurt helado sin contenido de  grasa (1 taza) / 0.3 g   Elija: Barras de fruta congeladas / vestigios   Evite: Crema batida (1 cucharada) / 3.5 g   Elija: Batidos glac sin lcteos (1 cucharada) / 1 g  Condimentos / Grasas saturadas (g)  Evite: Mayonesa (1 cucharada) / 2 g   Elija: Mayonesa con bajo contenido de grasa (1 cucharada) / 1 g   Evite: Manteca (1 cucharada) / 7 g   Elija: Margarina extra light (1 cucharada) / 1 g   Evite: Aceite de coco (1 cucharada) / 11.8 g   Elija: Aceite de oliva (1 cucharada) / 1.8 g   Elija: Aceite de maz (1 cucharada) / 1.7 g   Elija: Aceite de crtamo (1 cucharada) / 1.2 g   Elija: Aceite de girasol (1 cucharada) / 1.4 g   Elija: Aceite de soja (1 cucharada) / 2.4 g   Elija: Aceite de canola (1 cucharada) / 1 g  Document Released: 08/19/2005 Document Revised: 05/01/2011 [employer] Patient Information 2012 [employer]. Ejercicios para perder peso (Exercise to Lose Weight) La actividad fsica y una dieta saludable ayudan a perder peso. El mdico podr sugerirle ejercicios especficos. IDEAS Y CONSEJOS PARA HACER EJERCICIOS  Elija opciones econmicas que disfrute hacer , como caminar, andar en bicicleta o los vdeos para ejercitarse.   Utilice las escaleras en lugar del ascensor.   Camine durante la hora del almuerzo.   Estacione el auto lejos del lugar de trabajo o estudio.   Concurra a un gimnasio o tome clases de gimnasia.   Comience con 5  10 minutos de actividad fsica por da. Ejercite hasta 30 minutos, 4 a 6 das por semana.   Utilice zapatos que tengan un buen soporte y ropas cmodas.   Elongue antes y despus de ejercitar.   Ejercite hasta que aumente la respiracin y el corazn palpite rpido.   Beba agua extra cuando ejercite.   No haga ejercicio hasta lastimarse, sentirse mareado o que le falte mucho el aire.  La actividad fsica puede quemar alrededor de 150 caloras.  Correr 20 cuadras en 15 minutos.   Jugar vley durante 45 a 60  minutos.   Limpiar y encerar el auto durante 45 a 60 minutos.   Jugar ftbol americano de toque.   Caminar 25 cuadras en 35 minutos.   Empujar un cochecito 20 cuadras en 30 minutos.   Jugar baloncesto durante 30 minutos.   Rastrillar hojas secas durante 30 minutos.   Andar en bicicleta 80 cuadras en 30 minutos.     Caminar 30 cuadras en 30 minutos.   Bailar durante 30 minutos.   Quitar la nieve con una pala durante 15 minutos.   Nadar vigorosamente durante 20 minutos.   Subir escaleras durante 15 minutos.   Andar en bicicleta 60 cuadras durante 15 minutos.   Arreglar el jardn entre 30 y 45 minutos.   Saltar a la soga durante 15 minutos.   Limpiar vidrios o pisos durante 45 a 60 minutos.  Document Released: 11/23/2010 Document Revised: 05/01/2011 [facility] Patient Information 2012 [place], [place].  Venas varicosas que sangran (Bleeding Varicose Veins) Las venas varicosas son venas que se han agrandado y tornado sinuosas. Las vlvulas de las venas ayudan al retorno de la sangre desde las piernas hacia el corazn. Si estas vlvulas sufren una lesin, el flujo sanguneo retorna [employer] de la pierna, cerca de la piel. Esto hace que las venas se agranden debido al aumento de la presin interior. En algunos casos las venas sangran. CAUSAS  Los factores que pueden causar sangrado en las venas varicosas son los siguientes:  La piel que cubre las venas se afina. Esta piel se estira a medida que las venas se agrandan.  Las paredes de las venas varicosas se debilitan y [place]. Estas paredes delgadas son Neomia Dear de las causas por las que la sangre no fluye normalmente hacia el corazn.  Presin alta en las venas. La presin alta se debe a que la sangre no circula libremente Special educational needs teacher.  Lesiones. Incluso una pequea lesin en una vena varicosa puede producir sangrado.  Heridas abiertas. En la zona de una vena varicosa puede formarse una lcera y no curarse. Esto favorece  el sangrado.  Tomar anticoagulantes. Entre ellos se incluye la aspirina, los medicamentos antiinflamatorios y Health and safety inspector. SIGNOS Y SNTOMAS  Si el sangrado ocurre en la superficie exterior de la piel, la sangre puede verse. A veces, el sangrado ocurre debajo de la piel. En este caso, podr observarse una zona azul o prpura que se extender ms all de la vena. Este cambio del color puede ser visible. DIAGNSTICO  Para diagnosticar una vena varicosa que sangra, el mdico:  Preguntar acerca de sus sntomas, como por ejemplo, cundo fue la primera Publishing copy.  Le preguntar durante cunto tiempo ha tenido vrices y si le causan problemas.  Preguntar acerca de su salud en general.  Preguntar sobre las posibles causas, como cortes recientes o si se ha golpeado o lastimado cerca de las venas varicosas.  Examinar la piel o la pierna que le preocupa. Probablemente palpar las venas.  Solicitar estudios de diagnstico por imgenes. Con este estudio podr observarse una imagen detallada de las venas. TRATAMIENTO  El Civil engineer, contracting del tratamiento de las varices varicosas que sangran es detener el sangrado. Luego, el objetivo es impedir que el sangrado vuelva a Scientific laboratory technician. El tratamiento depender de la causa del sangrado y su gravedad. Consulte a su mdico qu es lo mejor para usted. Entre las opciones se incluyen las siguientes:  Lexicographer (elevar) la pierna. Acostarse con la pierna apoyada en una almohada o almohadn. El pie debe estar por encima del nivel del corazn.  Aplicar presin en la zona que est sangrando. El sangrado debe detenerse en un corto tiempo.  El uso de medias elsticas que "compriman" las piernas (medias de compresin). Una venda elstica podra tener el mismo [place].  Aplicacin de una crema antibitica en las lceras que no cicatrizan.  La eliminacin [place] o cierre de las venas varicosas que  sangran mediante uno de los siguientes  mtodos:  Regulatory affairs officer. Se inyecta una solucin en la vena para cerrarla.  Tratamiento con lser. Se utiliza el lser para calentar la vena a fin de cerrarla.  Ablacin de la vena por radiofrecuencia. Se utiliza una corriente elctrica producida por radiofrecuencia para sellar la vena.  Flebectoma. La vena se extirpa a travs de pequeas incisiones realizadas cerca de la vena varicosa.  Extirpacin venosa con ligadura. La vena se extirpa a travs de pequeas incisiones realizadas cerca de la vena varicosa una vez que se haya atado (ligado). INSTRUCCIONES PARA EL CUIDADO EN EL HOGAR   Aplique las cremas que su mdico le haya recetado. Siga cuidadosamente las indicaciones.  Use medias de compresin o cualquier vendaje como le haya indicado su mdico. Asegrese de saber:  Si debe usarlas todos los [place].  Cunto tiempo debera usarlas.  Si las venas se eliminaron o se cerraron, probablemente tendr un vendaje que cubrir la zona. Asegrese de saber:  Con qu frecuencia debe cambiar el apsito.  Si la zona se puede mojar.  Cundo puede dejar la piel al descubierto.  Revise la [employer]. Observe si aparecen nuevas llagas y signos de sangrado.  Para prevenir futuros sangrados:  Tenga especial cuidado en situaciones en las que se podra lastimar las piernas, por ejemplo cuando se rasura o trabaja en el jardn.  Trate de [employer] piernas elevadas el mayor tiempo posible. Recustese siempre que pueda. SOLICITE ATENCIN MDICA SI:   Las venas continan sangrando.  Aparecen llegas cerca de las venas varicosas.  Tiene una llaga que no se cura o se agranda.  Siente dolor cada vez ms intenso en la pierna.  La zona en torno a la vena varicosa est caliente, roja o sensible al tacto.  Observa que supura un lquido amarillento que huele mal en el lugar donde estaba sangrando.  Tiene fiebre. SOLICITE ATENCIN MDICA DE INMEDIATO SI:   Tiene dolor en el pecho o  dificultad para respirar.  Siente dolor intenso en la pierna.   Esta informacin no tiene Theme park manager el consejo del mdico. Asegrese de hacerle al mdico cualquier pregunta que tenga.   Document Released: 05/13/2012 Document Revised: 09/09/2014 Elsevier Interactive Patient Education [employer]

## 2016-04-04 NOTE — Progress Notes (Signed)
Destiny Rios 20-Oct-1970 [id number]   History:    45 y.o.  for annual gyn exam who also has been complaining of back spasms and painful lower extremities and 70 result of the varicose veins. 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 Patient has not seen her PCP and is currently fasting and would like to have her blood work drawn here. Patient with no past history of any abnormal Pap smears.  Past medical history,surgical history, family history and social history were all reviewed and documented in the EPIC chart.  Gynecologic History Patient's last menstrual period was 03/21/2016. Contraception: OCP (estrogen/progesterone) Last Pap: 2014. Results were: normal Last mammogram: 2016. Results were: normal  Obstetric History OB History  Gravida Para Term Preterm AB Living  SAB TAB Ectopic Multiple Live Births          2    # Outcome Date GA Lbr Len/2nd Weight Sex Delivery Anes PTL Lv  2 Term     F Vag-Spont  N LIV  1 Term     M Vag-Spont  N LIV       ROS: A ROS was performed and pertinent positives and negatives are included in the history.  GENERAL: No fevers or chills. HEENT: No change in vision, no earache, sore throat or sinus congestion. NECK: No pain or stiffness. CARDIOVASCULAR: No chest pain or pressure. No palpitations. PULMONARY: No shortness of breath, cough or wheeze. GASTROINTESTINAL: No abdominal pain, nausea, vomiting or diarrhea, melena or bright red blood per rectum. GENITOURINARY: No urinary frequency, urgency, hesitancy or dysuria. MUSCULOSKELETAL: Periodic back spasms DERMATOLOGIC: No rash, no itching, no lesions. ENDOCRINE: No polyuria, polydipsia, no heat or cold intolerance. No recent change in weight. HEMATOLOGICAL: No anemia or easy bruising or bleeding. NEUROLOGIC: No  headache, seizures, numbness, tingling or weakness. PSYCHIATRIC: No depression, no loss of interest in normal activity or change in sleep pattern.     Exam: chaperone present  BP 126/88   Ht 4' 10.5" (1.486 m)   Wt 188 lb (85.3 kg)   LMP 03/21/2016   BMI 38.62 kg/m   Body mass index is 38.62 kg/m.  General appearance : Well developed well nourished female. No acute distress HEENT: Eyes: no retinal hemorrhage or exudates,  Neck supple, trachea midline, no carotid bruits, no thyroidmegaly Lungs: Clear to auscultation, no rhonchi or wheezes, or rib retractions  Heart: Regular rate and rhythm, no murmurs or gallops Breast:Examined in sitting and supine position were symmetrical in appearance, no palpable masses or tenderness,  no skin retraction, no nipple inversion, no nipple discharge, no skin discoloration, no axillary or supraclavicular lymphadenopathy Abdomen: no palpable masses or tenderness, no rebound or guarding Extremities: Bilateral varicose veins, nontender, negative Homans sign, full range of motion  Pelvic:  [place], Urethra, Skene Glands: Within normal limits             Vagina: No gross lesions or discharge  Cervix: No gross lesions or discharge  Uterus  anteverted, normal size, shape and consistency, non-tender and mobile  Adnexa  Without masses or tenderness  Anus and perineum  normal   Rectovaginal  normal sphincter tone without palpated masses or tenderness             Hemoccult not indicated     Assessment/Plan:  45 y.o.  female for annual exam with a musculoskeletal strain in her neck muscles and trapezius. She'll be given a prescription Flexeril 5 mg to take 1 by mouth at bedtime when necessary #30 without any refills. Patient with bilateral lower extremity varicosity. Patient with full range of motion. No edema and negative Homans sign. I've recommended and gave her prescription for support stockings above-the-knee since she standing most the day at work and  in the evening to raise her legs to help with her circulation. Patient will have her fasting blood work today consisting of the following: Fasting lipid profile, comprehensive metabolic panel, TSH, CBC, and urinalysis. Pap smear was done today. Patient due for mammogram at the end of the year. I've given her literature information on exercise as well as cholesterol lowering diet. Literature information was provided in [place]. Her prescription refill for oral contraceptive pill provided.  An additional 15 minutes was spent on evaluating discussing patient went back spasm as well as large from the varicosity as well as obesity and diet and exercise.   Ok Edwards MD, 8:46 AM 04/04/2016

## 2016-04-04 NOTE — Addendum Note (Signed)
Addended by: Berna Spare A on: 04/04/2016 08:56 AM   Modules accepted: Orders

## 2016-04-05 LAB — URINALYSIS W MICROSCOPIC + REFLEX CULTURE
Bacteria, UA: NONE SEEN [HPF]
Bilirubin Urine: NEGATIVE
Casts: NONE SEEN [LPF]
GLUCOSE, UA: NEGATIVE
Ketones, ur: NEGATIVE
LEUKOCYTES UA: NEGATIVE
NITRITE: NEGATIVE
PH: 5.5 (ref 5.0–8.0)
Protein, ur: NEGATIVE
Specific Gravity, Urine: 1.027 (ref 1.001–1.035)
WBC, UA: NONE SEEN WBC/HPF (ref <=5)
YEAST: NONE SEEN [HPF]

## 2016-04-05 LAB — PAP IG W/ RFLX HPV ASCU

## 2016-04-06 LAB — URINE CULTURE: ORGANISM ID, BACTERIA: NO GROWTH

## 2016-04-08 ENCOUNTER — Other Ambulatory Visit: Payer: Self-pay | Admitting: Gynecology

## 2016-04-08 DIAGNOSIS — R319 Hematuria, unspecified: Secondary | ICD-10-CM

## 2016-04-10 ENCOUNTER — Ambulatory Visit (INDEPENDENT_AMBULATORY_CARE_PROVIDER_SITE_OTHER): Payer: 59 | Admitting: Gynecology

## 2016-04-10 ENCOUNTER — Encounter: Payer: Self-pay | Admitting: Gynecology

## 2016-04-10 ENCOUNTER — Other Ambulatory Visit: Payer: 59

## 2016-04-10 VITALS — BP 130/84

## 2016-04-10 DIAGNOSIS — M545 Low back pain, unspecified: Secondary | ICD-10-CM

## 2016-04-10 DIAGNOSIS — N3001 Acute cystitis with hematuria: Secondary | ICD-10-CM | POA: Diagnosis not present

## 2016-04-10 DIAGNOSIS — R350 Frequency of micturition: Secondary | ICD-10-CM

## 2016-04-10 DIAGNOSIS — A499 Bacterial infection, unspecified: Secondary | ICD-10-CM | POA: Diagnosis not present

## 2016-04-10 DIAGNOSIS — R300 Dysuria: Secondary | ICD-10-CM

## 2016-04-10 DIAGNOSIS — N760 Acute vaginitis: Secondary | ICD-10-CM | POA: Diagnosis not present

## 2016-04-10 DIAGNOSIS — R319 Hematuria, unspecified: Secondary | ICD-10-CM

## 2016-04-10 DIAGNOSIS — N898 Other specified noninflammatory disorders of vagina: Secondary | ICD-10-CM | POA: Diagnosis not present

## 2016-04-10 DIAGNOSIS — B9689 Other specified bacterial agents as the cause of diseases classified elsewhere: Secondary | ICD-10-CM

## 2016-04-10 LAB — URINALYSIS W MICROSCOPIC + REFLEX CULTURE
Bilirubin Urine: NEGATIVE
CASTS: NONE SEEN [LPF]
CRYSTALS: NONE SEEN [HPF]
Glucose, UA: NEGATIVE
KETONES UR: NEGATIVE
Leukocytes, UA: NEGATIVE
Nitrite: NEGATIVE
PROTEIN: NEGATIVE
Specific Gravity, Urine: 1.025 (ref 1.001–1.035)
YEAST: NONE SEEN [HPF]
pH: 5 (ref 5.0–8.0)

## 2016-04-10 LAB — WET PREP FOR TRICH, YEAST, CLUE
CLUE CELLS WET PREP: NONE SEEN
TRICH WET PREP: NONE SEEN
YEAST WET PREP: NONE SEEN

## 2016-04-10 MED ORDER — METRONIDAZOLE 500 MG PO TABS: 500.0000 mg | ORAL_TABLET | Freq: Two times a day (BID) | ORAL | 0 refills | Status: DC

## 2016-04-10 MED ORDER — CIPROFLOXACIN HCL 250 MG PO TABS: 250.0000 mg | ORAL_TABLET | Freq: Two times a day (BID) | ORAL | 0 refills | Status: DC

## 2016-04-10 MED ORDER — PHENAZOPYRIDINE HCL 200 MG PO TABS: 200.0000 mg | ORAL_TABLET | Freq: Three times a day (TID) | ORAL | 0 refills | Status: DC | PRN

## 2016-04-10 NOTE — Patient Instructions (Signed)
Metronidazole tablets or capsules Qu es este medicamento? El METRONIDAZOL es un antiinfeccioso. Se utiliza en el tratamiento de ciertos tipos de infecciones bacterianas y por protozoos. No es efectivo para resfros, gripe u otras infecciones de origen viral. Este medicamento puede ser utilizado para otros usos; si tiene alguna pregunta consulte con su proveedor de atencin mdica o con su farmacutico. Qu le debo informar a mi profesional de la salud antes de tomar este medicamento? Necesita saber si usted presenta alguno de los siguientes problemas o situaciones: -anemia u otros trastornos sanguneos -enfermedad del sistema nervioso -infeccin mictica o por levadura -si consume bebidas alcohlicas -enfermedad heptica -convulsiones -una reaccin alrgica o inusual al metronidazol, a otros medicamentos, alimentos, colorantes o conservantes -si est embarazada o buscando quedar embarazada -si est amamantando a un beb Cmo debo utilizar este medicamento? Tome este medicamento por va oral con un vaso lleno de agua. Siga las instrucciones de la etiqueta del [place]. Tome sus dosis a intervalos regulares. No tome su medicamento con una frecuencia mayor a la indicada. Complete todo el tratamiento con el medicamento como recetado aun si se siente mejor. No omita ninguna dosis o suspenda el uso de su medicamento antes de lo indicado. Hable con su pediatra para informarse acerca del uso de este medicamento en nios. Puede requerir atencin especial. Sobredosis: Pngase en contacto inmediatamente con un centro toxicolgico o una sala de urgencia si usted cree que haya tomado demasiado medicamento. ATENCIN: [employer] es solo para usted. No comparta este medicamento con nadie. Qu sucede si me olvido de una dosis? Si olvida una dosis, tmela lo antes posible. Si es casi la hora de la prxima dosis, tome slo esa dosis. No tome dosis adicionales o dobles. Qu puede interactuar con  este medicamento? No tome esta medicina con ninguno de los siguientes medicamentos: -alcohol o cualquier producto que contiene alcohol -solucin oral de amprenavir -cisapride -disulfiram -dofetilida -dronedarona -inyeccin de paclitaxel -pimozida -solucin oral de ritonavir -solucin oral de sertralina -inyeccin de sulfametoxasol-trimetoprima -tioridazina -ziprasidona Esta medicina tambin puede interactuar con los siguientes medicamentos: -pldoras anticonceptivas -cimetidina -litio -otros medicamentos que prolongan el intervalo QT (provoca un ritmo cardiaco anormal) -fenobarbital -fenitona -warfarina Puede ser que esta lista no menciona todas las posibles interacciones. Informe a su profesional de [employer]	[place] de [employer] productos a base de hierbas, medicamentos de [place] o suplementos nutritivos que est tomando. Si usted fuma, consume bebidas alcohlicas o si utiliza drogas ilegales, indqueselo tambin a su profesional de KB Home	[place]. Algunas sustancias pueden interactuar con su medicamento. A qu debo estar atento al usar [employer]? Consulte a su mdico o a su profesional de la salud si sus sntomas no mejoran o si empeoran. Puede experimentar mareos o somnolencia. No conduzca ni utilice maquinaria ni haga nada que Associate Professor en estado de alerta hasta que sepa cmo le afecta este medicamento. No se siente ni se ponga de pie con rapidez, especialmente si es un paciente de edad avanzada. Esto reduce el riesgo de mareos o [employer]. Evite las bebidas alcohlicas durante el tratamiento con este medicamento y [employer] tres das siguientes. El alcohol puede causarle mareos o hacerlo sentir enfermo o ruborizado. Si est recibiendo tratamiento para una enfermedad de transmisin sexual, no tenga relaciones sexuales hasta que haya completado el [place]. Es posible que su pareja tambin necesite [place]. Qu efectos secundarios puedo tener al [employer] este  medicamento? Efectos secundarios que debe informar a su mdico o a Barrister's clerk de la salud tan pronto  como sea posible: -reacciones alrgicas como erupcin cutnea o urticarias, hinchazn de la cara, labios o lengua -confusin, torpeza -dificultad para hablar -mareos -decoloracin o dolor de la boca -fiebre, infeccin -entumecimiento, hormigueo, dolor o debilidad en las manos o los pies -dificultad para orinar o cambios en el volumen de orina -enrojecimiento, formacin de ampollas, descamacin o distensin de la piel, inclusive dentro de la boca -convulsiones -cansancio o debilidad inusual -irritacin, resequedad o flujo de la vagina Efectos secundarios que, por lo general, no requieren atencin mdica (debe informarlos a su mdico o a su profesional de la salud si persisten o si son molestos): -diarrea -dolor de cabeza -irritabilidad -sabor metlico -nuseas -calambres o dolores estomacales -dificultad para conciliar el sueo Puede ser que esta lista no menciona todos los posibles efectos secundarios. Comunquese a su mdico por asesoramiento mdico [employer]. Usted puede informar los efectos secundarios a la FDA por telfono al 1-800-FDA-1088. Dnde debo guardar mi medicina? Mantngala fuera del alcance de los nios. Gurdela a [employer], menos de 25 grados C (77 grados F). Protjala de la luz. Mantenga el envase bien cerrado. Deseche todo el medicamento que no haya utilizado, despus de la fecha de vencimiento. ATENCIN: Este folleto es un resumen. Puede ser que no cubra toda la posible informacin. Si usted tiene preguntas acerca de esta medicina, consulte con su mdico, su farmacutico o su profesional de Technical sales engineer.    2016, Elsevier/Gold Standard. (2014-10-11 00:00:00) Vaginosis bacteriana (Bacterial Vaginosis) La vaginosis bacteriana es una infeccin vaginal que perturba el equilibrio normal de las bacterias que se encuentran en la vagina. Es  el resultado de un crecimiento excesivo de ciertas bacterias. Esta es la infeccin vaginal ms frecuente en mujeres en edad reproductiva. El tratamiento es importante para prevenir complicaciones, especialmente en mujeres embarazadas, dado que puede causar un parto prematuro. CAUSAS  La vaginosis bacteriana se origina por un aumento de bacterias nocivas que, generalmente, estn presentes en cantidades ms pequeas en la vagina. Varios tipos diferentes de bacterias pueden causar esta afeccin. Sin embargo, la causa de su desarrollo no se comprende totalmente. [place] o comportamientos pueden exponerlo a un mayor riesgo de desarrollar vaginosis bacteriana, entre los que se incluyen:  Tener una nueva pareja sexual o mltiples parejas sexuales.  Las duchas vaginales  El uso del DIU (dispositivo intrauterino) como mtodo anticonceptivo. El contagio no se produce en baos, por ropas de cama, en piscinas o por contacto con objetos. SIGNOS Y SNTOMAS  Algunas mujeres que padecen vaginosis bacteriana no presentan signos ni sntomas. Los sntomas ms comunes son:  Secrecin vaginal de color grisceo.  Secrecin vaginal con olor similar al [employer], especialmente despus de Retail banker.  Picazn o sensacin de ardor en la vagina o la vulva.  Ardor o dolor al [employer]. DIAGNSTICO  Su mdico analizar su historia clnica y le examinar la vagina para detectar signos de vaginosis bacteriana. Puede tomarle Truddie Coco de flujo vaginal. Su mdico examinar esta muestra con un microscopio para controlar las bacterias y clulas anormales. Tambin puede realizarse un anlisis del pH vaginal.  TRATAMIENTO  La vaginosis bacteriana puede tratarse con antibiticos, en forma de comprimidos o de crema vaginal. Puede indicarse una segunda tanda de antibiticos si la afeccin se repite despus del tratamiento. Debido a que la vaginosis bacteriana aumenta el riesgo de  contraer enfermedades de transmisin sexual, el tratamiento puede ayudar a reducir el riesgo de clamidia, [place], VIH y herpes. INSTRUCCIONES PARA EL CUIDADO EN EL  [employer] solo medicamentos de venta libre o recetados, segn las indicaciones del mdico.  Si le han recetado antibiticos, tmelos como se le indic. Asegrese de que finaliza la prescripcin completa aunque se sienta mejor.  Comunique a sus compaeros sexuales que sufre una infeccin vaginal. Deben consultar a su mdico y recibir tratamiento si tienen problemas, como picazn o una erupcin cutnea leve.  Durante el [place], es importante que siga estas indicaciones:  Visual merchandiser relaciones sexuales o use preservativos de la forma correcta.  No se haga duchas vaginales.  Evite consumir alcohol como se lo haya indicado el mdico.  Community education officer se lo haya indicado el mdico. SOLICITE ATENCIN MDICA SI:   Sus sntomas no mejoran despus de 3 das de [place].  Aumenta la secrecin o Conservation officer, historic buildings.  Tiene fiebre. ASEGRESE DE QUE:   Comprende estas instrucciones.  Controlar su afeccin.  Recibir ayuda de inmediato si no mejora o si empeora. PARA OBTENER MS INFORMACIN  Centros para el control y la prevencin de Probation officer for Disease Control and Prevention, CDC): AppraiserFraud.fi Asociacin Estadounidense de la Salud Sexual (American Sexual Health Association, SHA): www.ashastd.org    Esta informacin no tiene Marine scientist el consejo del mdico. Asegrese de hacerle al mdico cualquier pregunta que tenga.   Document Released: 11/26/2007 Document Revised: 09/09/2014 Elsevier Interactive Patient Education 2016 [place] tablets Qu es este medicamento? La FENAZOPIRIDINA es un analgsico. Genene Churn para Best boy, ardor o molestia causada por una infeccin o irritacin del tracto urinario. Este medicamento no es un antibitico. No curar una infeccin  del tracto urinario. Este medicamento puede ser utilizado para otros usos; si tiene alguna pregunta consulte con su proveedor de atencin mdica o con su farmacutico. Qu le debo informar a mi profesional de la salud antes de tomar este medicamento? Necesita saber si usted presenta alguno de los siguientes problemas o situaciones: -deficiencia de glucosa-6-fosfato deshidrogenasa (L-6PD) -enfermedad renal -una reaccin alrgica o inusual a la fenazopiridina, a otros medicamentos, alimentos, colorantes o conservantes -si est embarazada o buscando quedar embarazada -si est amamantando a un beb Cmo debo utilizar este medicamento? Tome este medicamento por va oral con un vaso de agua. Siga las instrucciones de la etiqueta del [place]. Tmelo despus de las comidas. Tome sus dosis a intervalos regulares. No tome su medicamento con una frecuencia mayor que la indicada. No omita ninguna dosis o suspenda el uso de su medicamento antes de lo indicado aun si se siente mejor. No deje de tomarlo excepto si as lo indica su mdico. Hable con su pediatra para informarse acerca del uso de este medicamento en nios. Puede requerir atencin especial. Sobredosis: Pngase en contacto inmediatamente con un centro toxicolgico o una sala de urgencia si usted cree que haya tomado demasiado medicamento. ATENCIN: [employer] es solo para usted. No comparta este medicamento con nadie. Qu sucede si me olvido de una dosis? Si olvida una dosis, tmela lo antes posible. Si es casi la hora de la prxima dosis, tome slo esa dosis. No tome dosis adicionales o dobles. Qu puede interactuar con este medicamento? No se esperan interacciones. Puede ser que esta lista no menciona todas las posibles interacciones. Informe a su profesional de [employer] Home	[place] de [employer] productos a base de hierbas, medicamentos de [place] o suplementos nutritivos que est tomando. Si usted fuma, consume bebidas alcohlicas o si  utiliza drogas ilegales, indqueselo tambin a su profesional de [employer] Home	[place]. Algunas sustancias pueden interactuar  con su medicamento. A qu debo estar atento al usar [employer]? Si los sntomas no mejoran o si empeoran, consulte con su mdico o con su profesional de [employer]. Este medicamento produce un color rojo en los lquidos corporales. Este efecto es inofensivo y Geneticist, molecular despus de que deje de tomar este medicamento. Este medicamento cambiar el color de su orina a un color naranja oscura o rojo. El color rojo puede manchar la ropa. Los lentes de contacto se pueden Soil scientist. Es preferible no usar lentes de contacto blandos mientras est tomando este medicamento Si es diabtico podr Barista un resultado positivo falso en los anlisis de determinacin del nivel de International aid/development worker en la orina. Consulte a su proveedor de Psychologist, prison and probation services. Qu efectos secundarios puedo tener al [employer] este medicamento? Efectos secundarios que debe informar a su mdico o a Producer, television/film/video de la salud tan pronto como sea posible: -Therapist, art como erupcin cutnea, picazn o urticarias, hinchazn de la cara, labios o lengua -color azul o morado de la piel -dificultad al respirar -fiebre -orinar menos -sangrado, magulladuras inusuales -cansancio o debilidad inusual -vmitos -color amarillento de los ojos o la piel Efectos secundarios que, por lo general, no requieren Psychologist, prison and probation services (debe informarlos a su mdico o a su profesional de la salud si persisten o si son molestos): -orina de color amarillo oscuro -dolor de cabeza -Programme researcher, broadcasting/film/video Puede ser que esta lista no menciona todos los posibles efectos secundarios. Comunquese a su mdico por asesoramiento mdico [employer]. Usted puede informar los efectos secundarios a la FDA por telfono al 1-800-FDA-1088. Dnde debo guardar mi medicina? Mantngala fuera del alcance de los nios. Gurdela a [employer], entre 15 y 30 grados C (64 y 42 grados F). Protjala de la luz y de la humedad. Deseche todo el medicamento que no haya utilizado, despus de la fecha de vencimiento. ATENCIN: Este folleto es un resumen. Puede ser que no cubra toda la posible informacin. Si usted tiene preguntas acerca de esta medicina, consulte con su mdico, su farmacutico o su profesional de Radiographer, therapeutic.    2016, Elsevier/Gold Standard. (2014-10-11 00:00:00) Ciprofloxacin tablets Qu es este medicamento? La CIPROFLOXACINA es un antibitico quinolnico. Se utiliza en el tratamiento de ciertos tipos de infecciones bacterianas. No es efectivo para resfros, gripe u otras infecciones de origen viral. Este medicamento puede ser utilizado para otros usos; si tiene alguna pregunta consulte con su proveedor de atencin mdica o con su farmacutico. Qu le debo informar a mi profesional de la salud antes de tomar este medicamento? Necesita saber si usted presenta alguno de los siguientes problemas o situaciones: -problemas de huesos -enfermedad cerebral -problemas de articulaciones -pulso cardaco irregular -enfermedad renal -enfermedad heptica -miastenia gravis -trastorno de convulsiones -problemas de tendones -una reaccin alrgica o inusual a la ciprofloxacina, a otros antibiticos, medicamentos, alimentos, colorantes o conservantes -si est embarazada o buscando quedar embarazada -si est amamantando a un beb Cmo debo utilizar este medicamento? Tome este medicamento por va oral con un vaso de agua. Siga las instrucciones de la etiqueta del [place]. Tome sus dosis a intervalos regulares. No tome su medicamento con una frecuencia mayor a la indicada. Tome todas las dosis de su medicamento como se le haya indicado aun si se siente mejor. No omita ninguna dosis o suspenda el uso de su medicamento antes de lo indicado. Este medicamento se puede tomar con las comidas o con el estmago vaco. No lo debe tomar con  productos lcteos, tales [place],  yogur o jugos fortificados con [employer], sin embargo lo puede tomar con comidas que contienen productos lcteos. Su farmacutico le dar una [place] del medicamento especial con cada receta y relleno. Asegrese de leer esta informacin cada vez cuidadosamente. Hable con su pediatra para informarse acerca del uso de este medicamento en nios. Puede requerir atencin especial. Sobredosis: Pngase en contacto inmediatamente con un centro toxicolgico o una sala de urgencia si usted cree que haya tomado demasiado medicamento. ATENCIN: [employer] es solo para usted. No comparta este medicamento con nadie. Qu sucede si me olvido de una dosis? Si olvida una dosis, tmela lo antes posible. Si es casi la hora de la prxima dosis, tome slo esa dosis. No tome dosis adicionales o dobles. Qu puede interactuar con este medicamento? No tome esta medicina con ninguno de los siguientes medicamentos: -cisapride -droperidol -terfenadina -tizanidina Esta medicina tambin puede interactuar con los siguientes medicamentos -anticidos -pldoras anticonceptivas -cafena -ciclosporina -polvo o tabletas tamponadas de didanosina (ddI) -medicamentos para la diabetes -medicamentos para la inflamacin, tales como ibuprofeno, naproxeno -metotrexato -multivitaminas -omeprazol -fenitona -probenecid -sucralfato -teofilina -warfarina Puede ser que esta lista no menciona todas las posibles interacciones. Informe a su profesional de [employer] de [employer] productos a base de hierbas, medicamentos de [place] o suplementos nutritivos que est tomando. Si usted fuma, consume bebidas alcohlicas o si utiliza drogas ilegales, indqueselo tambin a su profesional de [employer]. Algunas sustancias pueden interactuar con su medicamento. A qu debo estar atento al usar [employer]? Si los sntomas no mejoran, consulte con su mdico o con su profesional de [employer]. No  trate la diarrea con productos de [employer]. Comunquese con su mdico si tiene diarrea que dura ms de 2 das o si es severa y [place]. Puede experimentar somnolencia o mareos. No conduzca ni utilice maquinaria, ni haga nada que Scientist, research (life sciences) en estado de alerta hasta que sepa cmo le afecta este medicamento. No se siente ni se ponga de pie con rapidez, especialmente si es un paciente de edad avanzada. Esto reduce el riesgo de mareos o [employer]. Este medicamento puede aumentar la sensibilidad al sol. Mantngase fuera de Secretary/administrator. Si no lo puede evitar, utilice ropa protectora y crema de Orthoptist. No utilice lmparas solares, camas solares ni cabinas solares. Evite las preparaciones de anticidos, aluminio, calcio, hierro, magnesio o cinc por lo menos 6 horas antes o 2 horas despus de Financial risk analyst. Qu efectos secundarios puedo tener al [employer] este medicamento? Efectos secundarios que debe informar a su mdico o a Producer, television/film/video de la salud tan pronto como sea posible: -Therapist, art como erupcin cutnea, picazn o urticarias, hinchazn de la cara, labios o lengua -problemas respiratorios -confusin, pesadillas o alucinaciones -sensacin de desmayos o aturdimiento, cadas -pulso cardiaco irregular -hinchazn o dolores musculares, articulares o de tendones -dolor o dificultad para orinar -dolor de cabeza persistente con o sin visin borrosa -enrojecimiento, formacin de ampollas, descamacin o aflojamiento de la piel, inclusive dentro de la boca -convulsiones -dolor, entumecimiento, hormigueo o debilidad inusual Efectos secundarios que, por lo general, no requieren atencin mdica (debe informarlos a su mdico o a su profesional de la salud si persisten o si son molestos): -diarrea -nuseas o malestar estomacal -manchas blancas o llagas dentro de la boca Puede ser que esta lista no menciona todos los posibles efectos secundarios. Comunquese a su mdico  por asesoramiento mdico [employer]. Usted puede informar los efectos secundarios a la FDA por telfono  al 1-800-FDA-1088. Dnde debo guardar mi medicina? Mantngala fuera del alcance de los nios. Gurdela a [employer], a menos de 30 grados C (31 grados F). Mantenga el envase bien cerrado. Deseche todo el medicamento que no haya utilizado, despus de la fecha de vencimiento. ATENCIN: Este folleto es un resumen. Puede ser que no cubra toda la posible informacin. Si usted tiene preguntas acerca de esta medicina, consulte con su mdico, su farmacutico o su profesional de Technical sales engineer.    2016, Elsevier/Gold Standard. (2014-10-11 00:00:00) Infeccin urinaria  (Urinary Tract Infection)  La infeccin urinaria puede ocurrir en cualquier lugar del tracto urinario. El tracto urinario es un sistema de drenaje del cuerpo por el que se eliminan los desechos y el exceso de [place]. El tracto urinario est formado por dos riones, dos urteres, la vejiga y Geologist, engineering. Los riones son rganos que tienen forma de frijol. Cada rin tiene aproximadamente el tamao del puo. Estn situados debajo de las [place], uno a cada lado de la columna vertebral CAUSAS  La causa de la infeccin son los microbios, que son organismos microscpicos, que incluyen hongos, virus, y bacterias. Estos organismos son tan pequeos que slo pueden verse a travs del microscopio. Las bacterias son los microorganismos que ms comnmente causan infecciones urinarias.  SNTOMAS  Los sntomas pueden variar segn la edad y el sexo del paciente y por la ubicacin de la infeccin. Los sntomas en las mujeres jvenes incluyen la necesidad frecuente e intensa de orinar y una sensacin dolorosa de ardor en la vejiga o en la uretra durante la miccin. Las mujeres y los hombres mayores podrn sentir cansancio, temblores y debilidad y Arts development officer musculares y Social research officer, government abdominal. Si tiene [place], puede significar que la infeccin  est en los riones. Otros sntomas son dolor en la espalda o en los lados debajo de las [place], nuseas y vmitos.  DIAGNSTICO  Para diagnosticar una infeccin urinaria, el mdico le preguntar acerca de sus sntomas. [employer] una [place] de [place]. La muestra de orina se analiza para Hydrographic surveyor bacterias y glbulos blancos de Herbalist. Los glbulos blancos se forman en el organismo para ayudar a Radio broadcast assistant las infecciones.  TRATAMIENTO  Por lo general, las infecciones urinarias pueden tratarse con medicamentos. Debido a que la [employer] de las infecciones son causadas por bacterias, por lo general pueden tratarse con antibiticos. La eleccin del antibitico y la duracin del tratamiento depender de sus sntomas y el tipo de bacteria causante de la infeccin.  INSTRUCCIONES PARA EL CUIDADO EN EL HOGAR   Si le recetaron antibiticos, tmelos exactamente como su mdico le indique. Termine el medicamento aunque se sienta mejor despus de haber tomado slo algunos.  Beba gran cantidad de lquido para mantener la orina de tono claro o color amarillo plido.  Evite la cafena, el t y las bebidas gaseosas. Estas sustancias irritan la vejiga.  Vaciar la vejiga con frecuencia. Evite retener la orina durante largos perodos.  Vace la vejiga antes y despus de Clinical biochemist.  Despus de mover el intestino, las mujeres deben higienizarse la regin perineal desde adelante hacia atrs. Use slo un papel tissue por vez. SOLICITE ATENCIN MDICA SI:   Siente dolor en la espalda.  Le sube la fiebre.  Los sntomas no mejoran luego de 3 das. SOLICITE ATENCIN MDICA DE INMEDIATO SI:   Siente dolor intenso en la espalda o en la zona inferior del abdomen.  Comienza a sentir escalofros.  Tiene nuseas o vmitos.  Wilma Flavin  sensacin continua de quemazn o molestias al [employer]. ASEGRESE DE QUE:   Comprende estas instrucciones.  Controlar su enfermedad.  Solicitar  ayuda de inmediato si no mejora o empeora.   Esta informacin no tiene Marine scientist el consejo del mdico. Asegrese de hacerle al mdico cualquier pregunta que tenga.   Document Released: 05/29/2005 Document Revised: 05/13/2012 Elsevier Interactive Patient Education [employer].

## 2016-04-10 NOTE — Progress Notes (Signed)
   HPI: Patient is a 45 year old who was seen in the office 6 days ago for her annual gynecological exam. For the past 3 day she's been complaining of urinary frequency with dysuria and left flank discomfort. She denies any fever, chills, nausea, or vomiting. She is not noticing any blood in her urine. She has no GI complaints. Patient with no family history of kidney stones although she did state that her mother had some form of renal disease and had been on dialysis when she was older. Patient was also complaining of a clear vaginal discharge with odor. She is sexually active in a monogamous relationship and is on oral contraceptive pill and reports normal menstrual cycles.   ROS: A ROS was performed and pertinent positives and negatives are included in the history.  GENERAL: No fevers or chills. HEENT: No change in vision, no earache, sore throat or sinus congestion. NECK: No pain or stiffness. CARDIOVASCULAR: No chest pain or pressure. No palpitations. PULMONARY: No shortness of breath, cough or wheeze. GASTROINTESTINAL: No abdominal pain, nausea, vomiting or diarrhea, melena or bright red blood per rectum. GENITOURINARY: Dysuria and frequency and clear vaginal discharge with odor MUSCULOSKELETAL: Left flank pains. DERMATOLOGIC: No rash, no itching, no lesions. ENDOCRINE: No polyuria, polydipsia, no heat or cold intolerance. No recent change in weight. HEMATOLOGICAL: No anemia or easy bruising or bleeding. NEUROLOGIC: No headache, seizures, numbness, tingling or weakness. PSYCHIATRIC: No depression, no loss of interest in normal activity or change in sleep pattern.   PE: Blood pressure 130/84 Gen. appearance well-developed well-nourished seen over the above-mentioned complaint Back: No CVA tenderness Abdomen: Soft nontender slight suprapubic tenderness but no rebound or guarding Pelvic: Bartholin urethra Skene was within normal limits Vagina: No gross lesions or discharge on exam. Cervix: No lesions  or discharge Uterus: Anteverted slight suprapubic tenderness Adnexa: No palpable masses or tenderness Rectal exam not done  Urinalysis may bacteria, 10-20 RBC, 0-5 WBC  Wet prep few white blood cells and few bacteria.   Assessment Plan: Patient with signs and symptoms of urinary tract infection.Review of her record indicated that back in April of this year she had a urinary tract infection the microorganism was Enterobacter which was sensitive to Cipro for which she had been prescribed at that time. We are going to start her on Cipro 250 mg one by mouth twice a day for 3 days along with pretty in 300 mg one by mouth 3 times a day for 3 days to alleviate her symptoms. In the event of a combined I 20 vaginosis she was going to start Flagyl 500 mg twice a day for 7 days after she finishes the Cipro. She was instructed to increase her fluid intake. If her left flank tenderness does not improve her she has fever, chills, nausea vomiting she should report to the office or after-hours to the emergency room. We'll wait for the result of the culture sensitivity.     Greater than 50% of time was spent in counseling and coordinating care of this patient.   Time of consultation: 15   Minutes.

## 2016-04-12 LAB — URINE CULTURE: ORGANISM ID, BACTERIA: NO GROWTH

## 2016-10-09 ENCOUNTER — Encounter: Payer: Self-pay | Admitting: Gynecology

## 2016-12-25 DIAGNOSIS — K219 Gastro-esophageal reflux disease without esophagitis: Secondary | ICD-10-CM | POA: Insufficient documentation

## 2016-12-25 DIAGNOSIS — K581 Irritable bowel syndrome with constipation: Secondary | ICD-10-CM | POA: Insufficient documentation

## 2017-01-15 ENCOUNTER — Encounter: Payer: Self-pay | Admitting: Gynecology

## 2017-03-25 ENCOUNTER — Other Ambulatory Visit: Payer: Self-pay | Admitting: Gynecology

## 2017-04-17 ENCOUNTER — Ambulatory Visit (INDEPENDENT_AMBULATORY_CARE_PROVIDER_SITE_OTHER): Payer: 59 | Admitting: Obstetrics & Gynecology

## 2017-04-17 ENCOUNTER — Encounter: Payer: Self-pay | Admitting: Obstetrics & Gynecology

## 2017-04-17 VITALS — BP 128/86 | Ht 59.0 in | Wt 185.0 lb

## 2017-04-17 DIAGNOSIS — Z113 Encounter for screening for infections with a predominantly sexual mode of transmission: Secondary | ICD-10-CM | POA: Diagnosis not present

## 2017-04-17 DIAGNOSIS — Z3041 Encounter for surveillance of contraceptive pills: Secondary | ICD-10-CM | POA: Diagnosis not present

## 2017-04-17 DIAGNOSIS — R300 Dysuria: Secondary | ICD-10-CM | POA: Diagnosis not present

## 2017-04-17 DIAGNOSIS — N3090 Cystitis, unspecified without hematuria: Secondary | ICD-10-CM

## 2017-04-17 DIAGNOSIS — Z01419 Encounter for gynecological examination (general) (routine) without abnormal findings: Secondary | ICD-10-CM | POA: Diagnosis not present

## 2017-04-17 DIAGNOSIS — Z1151 Encounter for screening for human papillomavirus (HPV): Secondary | ICD-10-CM | POA: Diagnosis not present

## 2017-04-17 DIAGNOSIS — B002 Herpesviral gingivostomatitis and pharyngotonsillitis: Secondary | ICD-10-CM

## 2017-04-17 LAB — URINALYSIS W MICROSCOPIC + REFLEX CULTURE
BILIRUBIN URINE: NEGATIVE
Casts: NONE SEEN [LPF]
Crystals: NONE SEEN [HPF]
GLUCOSE, UA: NEGATIVE
KETONES UR: NEGATIVE
LEUKOCYTES UA: NEGATIVE
Nitrite: NEGATIVE
PH: 5.5 (ref 5.0–8.0)
Protein, ur: NEGATIVE
Yeast: NONE SEEN [HPF]

## 2017-04-17 LAB — CBC
HCT: 44.3 % (ref 35.0–45.0)
Hemoglobin: 14.6 g/dL (ref 11.7–15.5)
MCH: 31.5 pg (ref 27.0–33.0)
MCHC: 33 g/dL (ref 32.0–36.0)
MCV: 95.7 fL (ref 80.0–100.0)
MPV: 10.5 fL (ref 7.5–12.5)
PLATELETS: 242 10*3/uL (ref 140–400)
RBC: 4.63 MIL/uL (ref 3.80–5.10)
RDW: 13.1 % (ref 11.0–15.0)
WBC: 9.6 10*3/uL (ref 3.8–10.8)

## 2017-04-17 LAB — TSH: TSH: 0.71 m[IU]/L

## 2017-04-17 MED ORDER — LEVONORGESTREL-ETHINYL ESTRAD 0.1-20 MG-MCG PO TABS: 1.0000 | ORAL_TABLET | Freq: Every day | ORAL | 4 refills | Status: DC

## 2017-04-17 MED ORDER — VALACYCLOVIR HCL 500 MG PO TABS
500.0000 mg | ORAL_TABLET | Freq: Two times a day (BID) | ORAL | 6 refills | Status: AC
Start: 2017-04-17 — End: 2017-04-20

## 2017-04-17 MED ORDER — NITROFURANTOIN MONOHYD MACRO 100 MG PO CAPS: 100.0000 mg | ORAL_CAPSULE | Freq: Two times a day (BID) | ORAL | 4 refills | Status: DC

## 2017-04-17 NOTE — Progress Notes (Signed)
Destiny Rios [birth year] [id number]   History:    46 y.o. G2P2 Married.  RP:  Established patient presenting for annual gyn exam  HPI:  Well on Aviane.  No BTB.  No pelvic pain.  Normal vaginal secretions.  Frequent UTI, usually post IC.  Prophylaxis with MacroBID pre IC is helping.  Thinks she might have a bladder infection now, some burning and frequency.  No fever.  Frequent Cold sores around the mouth, takes Valtrex for recurrences.  Breasts wnl.  BMs wnl.  Past medical history,surgical history, family history and social history were all reviewed and documented in the EPIC chart.  Gynecologic History No LMP recorded. Contraception: OCP (estrogen/progesterone) Last Pap: 04/2016. Results were: normal Last mammogram: 10/2016. Results were: Negative  Obstetric History OB History  Gravida Para Term Preterm AB Living  '2 2 2     2  ' SAB TAB Ectopic Multiple Live Births          2    # Outcome Date GA Lbr Len/2nd Weight Sex Delivery Anes PTL Lv  2 Term     F Vag-Spont  N LIV  1 Term     M Vag-Spont  N LIV       ROS: A ROS was performed and pertinent positives and negatives are included in the history.  GENERAL: No fevers or chills. HEENT: No change in vision, no earache, sore throat or sinus congestion. NECK: No pain or stiffness. CARDIOVASCULAR: No chest pain or pressure. No palpitations. PULMONARY: No shortness of breath, cough or wheeze. GASTROINTESTINAL: No abdominal pain, nausea, vomiting or diarrhea, melena or bright red blood per rectum. GENITOURINARY: No urinary frequency, urgency, hesitancy or dysuria. MUSCULOSKELETAL: No joint or muscle pain, no back pain, no recent trauma. DERMATOLOGIC: No rash, no itching, no lesions. ENDOCRINE: No polyuria, polydipsia, no heat or cold intolerance. No recent change in weight. HEMATOLOGICAL: No anemia or easy bruising or bleeding. NEUROLOGIC: No headache, seizures, numbness, tingling or weakness. PSYCHIATRIC: No depression, no loss of  interest in normal activity or change in sleep pattern.     Exam:   There were no vitals taken for this visit.  There is no height or weight on file to calculate BMI.  General appearance : Well developed well nourished female. No acute distress HEENT: Eyes: no retinal hemorrhage or exudates,  Neck supple, trachea midline, no carotid bruits, no thyroidmegaly Lungs: Clear to auscultation, no rhonchi or wheezes, or rib retractions  Heart: Regular rate and rhythm, no murmurs or gallops Breast:Examined in sitting and supine position were symmetrical in appearance, no palpable masses or tenderness,  no skin retraction, no nipple inversion, no nipple discharge, no skin discoloration, no axillary or supraclavicular lymphadenopathy Abdomen: no palpable masses or tenderness, no rebound or guarding Extremities: no edema or skin discoloration or tenderness  Pelvic: Vulva normal  Bartholin, Urethra, Skene Glands: Within normal limits             Vagina: No gross lesions or discharge  Cervix: No gross lesions or discharge.  Pap/HPV/Gono-Chlam done.  Uterus  AV, normal size, shape and consistency, non-tender and mobile  Adnexa  Without masses or tenderness  Anus and perineum  normal   Assessment/Plan:  46 y.o. female for annual exam   1. Encounter for routine gynecological examination with Papanicolaou smear of cervix Normal gyn exam.  Pap/HPV done.  Breasts wnl.  Screening Mammo neg 10/2016. - CBC - Comp Met (CMET) - Lipid panel - TSH - Vitamin D 1,25  dihydroxy  2. Screen for STD (sexually transmitted disease) Gono-Chlam done on Pap. - HIV antibody - RPR - Hepatitis C Antibody - Hepatitis B Surface AntiGEN  3. Encounter for surveillance of contraceptive pills Well on Aviane 1/20, represcribed.  4. Recurrent cystitis U/A today pos for Blood/WBCs/Bacterias.  U. Culture pending.  MacroBID PO BID x 5 to take now.  Prescription of MacroBID also sent for Prophylaxis 1 tab with IC.  5.  Oral herpes Frequent recurrences.  Valacyclovir treatment sent to pharmacy.  Counseling on above issues >50% x 10 minutes.  Princess Bruins MD, 10:02 AM 04/17/2017

## 2017-04-17 NOTE — Patient Instructions (Addendum)
1. Encounter for routine gynecological examination with Papanicolaou smear of cervix Normal gyn exam.  Pap/HPV done.  Breasts wnl.  Screening Mammo neg 10/2016. - CBC - Comp Met (CMET) - Lipid panel - TSH - Vitamin D 1,25 dihydroxy  2. Screen for STD (sexually transmitted disease) Gono-Chlam done on Pap. - HIV antibody - RPR - Hepatitis C Antibody - Hepatitis B Surface AntiGEN  3. Encounter for surveillance of contraceptive pills Well on Aviane 1/20, represcribed.  4. Recurrent cystitis U/A today pos for Blood/WBCs/Bacterias.  U. Culture pending.  MacroBID PO BID x 5 to take now.  Prescription of MacroBID also sent for Prophylaxis 1 tab with IC.  5. Oral herpes Frequent recurrences.  Valacyclovir treatment sent to pharmacy.  Destiny Rios, it was a pleasure to meet you today!  I will inform you of your results as soon as available.

## 2017-04-18 ENCOUNTER — Telehealth: Payer: Self-pay | Admitting: *Deleted

## 2017-04-18 LAB — COMPREHENSIVE METABOLIC PANEL
ALK PHOS: 70 U/L (ref 33–115)
ALT: 15 U/L (ref 6–29)
AST: 11 U/L (ref 10–35)
Albumin: 4.2 g/dL (ref 3.6–5.1)
BILIRUBIN TOTAL: 0.5 mg/dL (ref 0.2–1.2)
BUN: 11 mg/dL (ref 7–25)
CO2: 25 mmol/L (ref 20–32)
CREATININE: 0.77 mg/dL (ref 0.50–1.10)
Calcium: 9.4 mg/dL (ref 8.6–10.2)
Chloride: 106 mmol/L (ref 98–110)
GLUCOSE: 98 mg/dL (ref 65–99)
Potassium: 4.2 mmol/L (ref 3.5–5.3)
SODIUM: 139 mmol/L (ref 135–146)
TOTAL PROTEIN: 6.9 g/dL (ref 6.1–8.1)

## 2017-04-18 LAB — RPR

## 2017-04-18 LAB — HEPATITIS B SURFACE ANTIGEN: Hepatitis B Surface Ag: NONREACTIVE

## 2017-04-18 LAB — URINE CULTURE: Organism ID, Bacteria: NO GROWTH

## 2017-04-18 LAB — PAP IG, CT-NG NAA, HPV HIGH-RISK
CHLAMYDIA PROBE AMP: NOT DETECTED
GC PROBE AMP: NOT DETECTED
HPV DNA High Risk: NOT DETECTED

## 2017-04-18 LAB — LIPID PANEL
CHOL/HDL RATIO: 3.9 ratio (ref <5.0)
CHOLESTEROL: 180 mg/dL (ref <200)
HDL: 46 mg/dL — ABNORMAL LOW (ref >50)
LDL CALC: 102 mg/dL — AB (ref <100)
Triglycerides: 158 mg/dL — ABNORMAL HIGH (ref <150)
VLDL: 32 mg/dL — AB (ref <30)

## 2017-04-18 LAB — HEPATITIS C ANTIBODY: HCV Ab: NONREACTIVE

## 2017-04-18 LAB — HIV ANTIBODY (ROUTINE TESTING W REFLEX): HIV 1&2 Ab, 4th Generation: NONREACTIVE

## 2017-04-18 MED ORDER — NITROFURANTOIN MONOHYD MACRO 100 MG PO CAPS: ORAL_CAPSULE | ORAL | 0 refills | Status: DC

## 2017-04-18 MED ORDER — NITROFURANTOIN MONOHYD MACRO 100 MG PO CAPS: ORAL_CAPSULE | ORAL | 4 refills | Status: DC

## 2017-04-18 NOTE — Telephone Encounter (Signed)
Pt was seen on 04/17/17 and Rx for Macrobid was not at pharmacy. Rx was printed due to directions and never called in. See office note 2 Rx were sent due to "MacroBID PO BID x 5 to take now.  Prescription of MacroBID also sent for Prophylaxis 1 tab with IC." Rx re-sent. Pt aware.

## 2017-04-20 LAB — VITAMIN D 1,25 DIHYDROXY
Vitamin D 1, 25 (OH)2 Total: 68 pg/mL (ref 18–72)
Vitamin D2 1, 25 (OH)2: <8 pg/mL
Vitamin D3 1, 25 (OH)2: 68 pg/mL

## 2017-06-04 ENCOUNTER — Encounter: Payer: Self-pay | Admitting: Women's Health

## 2017-06-04 ENCOUNTER — Ambulatory Visit (INDEPENDENT_AMBULATORY_CARE_PROVIDER_SITE_OTHER): Payer: 59 | Admitting: Women's Health

## 2017-06-04 VITALS — BP 124/78

## 2017-06-04 DIAGNOSIS — R350 Frequency of micturition: Secondary | ICD-10-CM

## 2017-06-04 DIAGNOSIS — Z23 Encounter for immunization: Secondary | ICD-10-CM | POA: Diagnosis not present

## 2017-06-04 DIAGNOSIS — N644 Mastodynia: Secondary | ICD-10-CM | POA: Diagnosis not present

## 2017-06-04 NOTE — Progress Notes (Signed)
Presents with complaint of right breast pain for the past 3 weeks. Mostly on the outer aspect. No change in SBE or nipple discharge. Denies injury, change in routine of exercise or work. Denies excessive caffeine intake. Normal mammogram 10/2016 at [facility], no family history of breast cancer. Also having urinary frequency without nocturia, pain or burning, abdominal pain or fever. Denies vaginal discharge. History of recurrent UTIs. Monthly cycle on OCs.  Exam: Appears well. Breast exam and sitting and lying position without palpable nodules, visible erythema, ecchymosis, dimpling or retractions. No nipple discharge. Tenderness on entire outer aspect of right breast. UA 2+ blood, negative nitrites, negative leukocytes, no wbc's, 10-20 rbc's, moderate bacteria, 10-20 squamous epithelials  Right breast mastodynia Urinary frequency  Plan: Right breast diagnostic mammogram. Encouraged to avoid caffeine. Urine culture pending.

## 2017-06-04 NOTE — Patient Instructions (Signed)

## 2017-06-05 ENCOUNTER — Telehealth: Payer: Self-pay | Admitting: *Deleted

## 2017-06-05 LAB — URINALYSIS W MICROSCOPIC + REFLEX CULTURE
Bilirubin Urine: NEGATIVE
Glucose, UA: NEGATIVE
HYALINE CAST: NONE SEEN /LPF
Leukocyte Esterase: NEGATIVE
NITRITES URINE, INITIAL: NEGATIVE
PH: 5.5 (ref 5.0–8.0)
Protein, ur: NEGATIVE
Specific Gravity, Urine: 1.025 (ref 1.001–1.03)
WBC, UA: NONE SEEN /HPF (ref 0–5)

## 2017-06-05 LAB — NO CULTURE INDICATED

## 2017-06-05 NOTE — Telephone Encounter (Signed)
Pt scheduled on 06/11/17 @ 9:15am at [facility] (same location mammogram was done) left detailed message on pt voicemail per her request. Order faxed [phone]

## 2017-06-05 NOTE — Telephone Encounter (Signed)
-----   Message from Harrington Challenger, NP sent at 06/04/2017 12:47 PM EDT ----- Needs right breast diagnostic mammogram/US for breast pain mostly on outer aspect for past 3 week, any time ok.  Prefers to have done at [facility] if possible

## 2017-08-24 ENCOUNTER — Encounter: Payer: Self-pay | Admitting: Emergency Medicine

## 2017-08-24 ENCOUNTER — Emergency Department (INDEPENDENT_AMBULATORY_CARE_PROVIDER_SITE_OTHER)
Admission: EM | Admit: 2017-08-24 | Discharge: 2017-08-24 | Disposition: A | Discharge status: Home / Self Care | Payer: Self-pay | Source: Home / Self Care | Attending: Emergency Medicine | Admitting: Emergency Medicine

## 2017-08-24 ENCOUNTER — Emergency Department (INDEPENDENT_AMBULATORY_CARE_PROVIDER_SITE_OTHER): Payer: 59

## 2017-08-24 ENCOUNTER — Other Ambulatory Visit: Payer: Self-pay

## 2017-08-24 DIAGNOSIS — M545 Low back pain, unspecified: Secondary | ICD-10-CM

## 2017-08-24 DIAGNOSIS — S29019A Strain of muscle and tendon of unspecified wall of thorax, initial encounter: Secondary | ICD-10-CM

## 2017-08-24 DIAGNOSIS — R200 Anesthesia of skin: Secondary | ICD-10-CM | POA: Diagnosis not present

## 2017-08-24 DIAGNOSIS — M546 Pain in thoracic spine: Secondary | ICD-10-CM

## 2017-08-24 DIAGNOSIS — S39012A Strain of muscle, fascia and tendon of lower back, initial encounter: Secondary | ICD-10-CM

## 2017-08-24 LAB — POCT URINE PREGNANCY: Preg Test, Ur: NEGATIVE

## 2017-08-24 MED ORDER — CYCLOBENZAPRINE HCL 10 MG PO TABS: 10.0000 mg | ORAL_TABLET | Freq: Every day | ORAL | 0 refills | Status: DC

## 2017-08-24 MED ORDER — HYDROCODONE-ACETAMINOPHEN 5-325 MG PO TABS: 1.0000 | ORAL_TABLET | ORAL | 0 refills | Status: DC | PRN

## 2017-08-24 MED ORDER — NAPROXEN SODIUM 550 MG PO TABS: 550.0000 mg | ORAL_TABLET | Freq: Two times a day (BID) | ORAL | 0 refills | Status: DC
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
## 2017-12-17 DIAGNOSIS — M47812 Spondylosis without myelopathy or radiculopathy, cervical region: Secondary | ICD-10-CM | POA: Insufficient documentation

## 2017-12-17 DIAGNOSIS — M5412 Radiculopathy, cervical region: Secondary | ICD-10-CM | POA: Insufficient documentation

## 2018-02-18 ENCOUNTER — Encounter: Payer: Self-pay | Admitting: Obstetrics & Gynecology

## 2018-02-18 ENCOUNTER — Ambulatory Visit: Payer: Managed Care, Other (non HMO) | Admitting: Obstetrics & Gynecology

## 2018-02-18 DIAGNOSIS — N898 Other specified noninflammatory disorders of vagina: Secondary | ICD-10-CM | POA: Diagnosis not present

## 2018-02-18 DIAGNOSIS — N9089 Other specified noninflammatory disorders of vulva and perineum: Secondary | ICD-10-CM | POA: Diagnosis not present

## 2018-02-18 DIAGNOSIS — R300 Dysuria: Secondary | ICD-10-CM | POA: Diagnosis not present

## 2018-02-18 LAB — WET PREP FOR TRICH, YEAST, CLUE

## 2018-02-18 MED ORDER — FLUCONAZOLE 150 MG PO TABS: 150.0000 mg | ORAL_TABLET | Freq: Every day | ORAL | 1 refills | Status: AC

## 2018-02-18 MED ORDER — TERCONAZOLE 0.8 % VA CREA: 1.0000 | TOPICAL_CREAM | Freq: Every day | VAGINAL | 0 refills | Status: DC

## 2018-02-18 MED ORDER — CLOBETASOL PROPIONATE 0.05 % EX OINT: 1.0000 "application " | TOPICAL_OINTMENT | Freq: Two times a day (BID) | CUTANEOUS | 0 refills | Status: AC

## 2018-02-18 MED ORDER — FLUCONAZOLE 150 MG PO TABS: 150.0000 mg | ORAL_TABLET | Freq: Once | ORAL | 0 refills | Status: DC

## 2018-02-18 NOTE — Progress Notes (Signed)
    Destiny Rios March 07, 1971 [id number]        47 y.o.  G2P2L2 married  RP: Vaginal itching, vulvar irritation and lower abdominal discomfort with frequent urination with burning x 5 days  HPI: Patient had a bladder infection treated with antibiotics recently.  Took one Diflucan at the end of the antibiotic treatment.  Following that, she developed a vaginal discharge with itching and vulvar irritation.  Currently experiencing some urinary frequency and burning with micturition.  Mild lower abdominal discomfort.  No fever. Declines STD screening.  H/O labial HSV (Cold sores).   OB History  Gravida Para Term Preterm AB Living  2 2 2     2   SAB TAB Ectopic Multiple Live Births          2    # Outcome Date GA Lbr Len/2nd Weight Sex Delivery Anes PTL Lv  2 Term     F Vag-Spont  N LIV  1 Term     M Vag-Spont  N LIV    Past medical history,surgical history, problem list, medications, allergies, family history and social history were all reviewed and documented in the EPIC chart.   Directed ROS with pertinent positives and negatives documented in the history of present illness/assessment and plan.  Exam:  There were no vitals filed for this visit. General appearance:  Normal  Abdomen: Normal CVAT neg bilaterally  Gynecologic exam: Vulva:  Anterior vulvar irritation with whitening, erythema and tiny ulcerations.  HSV SureSwab done.  Speculum:  Cervix/Vagina normal.  Increased discharge.  Wet prep done.  U/A: Yellow clear, nitrites negative, white blood cells negative, red blood cells 3-10, bacteria few.  Urine culture pending. Wet prep: Yeasts present   Assessment/Plan:  47 y.o. G2P2002   1. Vaginal pruritus Yeast vaginitis clinically and confirmed with wet prep.  Will treat with fluconazole 150 mg 1 tablet daily for 3 days followed by terconazole 3 x3 nights.  Probiotic tablets or suppositories vaginally weekly for prevention. - WET PREP FOR TRICH, YEAST, CLUE  2.  Dysuria Probably no acute cystitis.  Will wait on urine culture to decide on treatment. - Urinalysis,Complete w/RFL Culture  3. Vulvar irritation Anterior vulvitis.  Not likely genital herpes, but HSV sure swab done to rule it out.  Will apply clobetasol 0.05% ointment a thin layer on the affected anterior vulva twice daily for 1 week. - SureSwab HSV, Type 1/2 DNA, PCR  Other orders - terconazole (TERAZOL 3) 0.8 % vaginal cream; Place 1 applicator vaginally at bedtime. - clobetasol ointment (TEMOVATE) 0.05 %; Apply 1 application topically 2 (two) times daily for 7 days. - fluconazole (DIFLUCAN) 150 MG tablet; Take 1 tablet (150 mg total) by mouth daily for 3 days.  Counseling on above issues and coordination of care more than 50% for 25 minutes.  Genia DelMarie-Lyne Jaquae Rieves MD, 2:39 PM 02/18/2018

## 2018-02-18 NOTE — Patient Instructions (Signed)
1. Vaginal pruritus Yeast vaginitis clinically and confirmed with wet prep.  Will treat with fluconazole 150 mg 1 tablet daily for 3 days followed by terconazole 3 x3 nights.  Probiotic tablets or suppositories vaginally weekly for prevention. - WET PREP FOR TRICH, YEAST, CLUE  2. Dysuria Probably no acute cystitis.  Will wait on urine culture to decide on treatment. - Urinalysis,Complete w/RFL Culture  3. Vulvar irritation Anterior vulvitis.  Not likely genital herpes, but HSV sure swab done to rule it out.  Will apply clobetasol 0.05% ointment a thin layer on the affected anterior vulva twice daily for 1 week. - SureSwab HSV, Type 1/2 DNA, PCR  Other orders - terconazole (TERAZOL 3) 0.8 % vaginal cream; Place 1 applicator vaginally at bedtime. - clobetasol ointment (TEMOVATE) 0.05 %; Apply 1 application topically 2 (two) times daily for 7 days. - fluconazole (DIFLUCAN) 150 MG tablet; Take 1 tablet (150 mg total) by mouth daily for 3 days.  Kyndra, it was a pleasure seeing you today!  I will inform you of your results as soon as they are available.

## 2018-02-20 LAB — URINALYSIS, COMPLETE W/RFL CULTURE
BILIRUBIN URINE: NEGATIVE
Glucose, UA: NEGATIVE
HYALINE CAST: NONE SEEN /LPF
Leukocyte Esterase: NEGATIVE
NITRITES URINE, INITIAL: NEGATIVE
PH: 5 (ref 5.0–8.0)
Protein, ur: NEGATIVE
SPECIFIC GRAVITY, URINE: 1.025 (ref 1.001–1.03)
WBC, UA: NONE SEEN /HPF (ref 0–5)

## 2018-02-20 LAB — URINE CULTURE
MICRO NUMBER:: 90739100
Result:: NO GROWTH
SPECIMEN QUALITY: ADEQUATE

## 2018-02-20 LAB — CULTURE INDICATED

## 2018-02-20 LAB — SURESWAB HSV, TYPE 1/2 DNA, PCR
HSV 1 DNA: NOT DETECTED
HSV 2 DNA: NOT DETECTED

## 2018-03-31 ENCOUNTER — Other Ambulatory Visit: Payer: Self-pay

## 2018-03-31 MED ORDER — LEVONORGESTREL-ETHINYL ESTRAD 0.1-20 MG-MCG PO TABS: 1.0000 | ORAL_TABLET | Freq: Every day | ORAL | 0 refills | Status: DC

## 2018-06-05 ENCOUNTER — Other Ambulatory Visit: Payer: Self-pay | Admitting: Obstetrics & Gynecology

## 2018-06-05 NOTE — Telephone Encounter (Signed)
Appointment on 06/16/18

## 2018-06-16 ENCOUNTER — Encounter: Payer: Self-pay | Admitting: Obstetrics & Gynecology

## 2018-06-16 ENCOUNTER — Ambulatory Visit (INDEPENDENT_AMBULATORY_CARE_PROVIDER_SITE_OTHER): Payer: Managed Care, Other (non HMO) | Admitting: Obstetrics & Gynecology

## 2018-06-16 VITALS — BP 130/80 | Ht 58.75 in | Wt 184.0 lb

## 2018-06-16 DIAGNOSIS — Z01419 Encounter for gynecological examination (general) (routine) without abnormal findings: Secondary | ICD-10-CM

## 2018-06-16 DIAGNOSIS — Z3041 Encounter for surveillance of contraceptive pills: Secondary | ICD-10-CM | POA: Diagnosis not present

## 2018-06-16 DIAGNOSIS — R350 Frequency of micturition: Secondary | ICD-10-CM

## 2018-06-16 DIAGNOSIS — Z6837 Body mass index (BMI) 37.0-37.9, adult: Secondary | ICD-10-CM | POA: Diagnosis not present

## 2018-06-16 DIAGNOSIS — E6609 Other obesity due to excess calories: Secondary | ICD-10-CM

## 2018-06-16 DIAGNOSIS — N3090 Cystitis, unspecified without hematuria: Secondary | ICD-10-CM | POA: Diagnosis not present

## 2018-06-16 MED ORDER — LEVONORGESTREL-ETHINYL ESTRAD 0.1-20 MG-MCG PO TABS: 1.0000 | ORAL_TABLET | Freq: Every day | ORAL | 4 refills | Status: DC

## 2018-06-16 MED ORDER — NITROFURANTOIN MONOHYD MACRO 100 MG PO CAPS: 100.0000 mg | ORAL_CAPSULE | Freq: Once | ORAL | 3 refills | Status: AC

## 2018-06-16 NOTE — Progress Notes (Signed)
Destiny Rios [birth year] [id number]   History:    47 y.o. G2P2L2 Married  RP:  Established patient presenting for annual gyn exam   HPI: Well on Aviane BCPs.  No breakthrough bleeding.  No pelvic pain.  No pain with intercourse.  Tendency for postcoital cystitis.  Taking Macrobid 1 tablet postcoitally for prophylaxis.  Complains of urinary frequency currently.  No pain with urination and no blood in urine.  No fever.  Bowel movements normal.  Breasts normal.  Body mass index 37.48.  Needs to increase physical activity.  Will do fasting health labs here today.  Past medical history,surgical history, family history and social history were all reviewed and documented in the EPIC chart.  Gynecologic History Patient's last menstrual period was 05/26/2018. Contraception: OCP (estrogen/progesterone) Last Pap: 04/2017. Results were: Negative/HPV HR neg Last mammogram: 10/2017. Results were: Negative Bone Density: Never Colonoscopy: Never  Obstetric History OB History  Gravida Para Term Preterm AB Living  _0 SAB TAB Ectopic Multiple Live Births          2    # Outcome Date GA Lbr Len/2nd Weight Sex Delivery Anes PTL Lv  2 Term     F Vag-Spont  N LIV  1 Term     M Vag-Spont  N LIV     ROS: A ROS was performed and pertinent positives and negatives are included in the history.  GENERAL: No fevers or chills. HEENT: No change in vision, no earache, sore throat or sinus congestion. NECK: No pain or stiffness. CARDIOVASCULAR: No chest pain or pressure. No palpitations. PULMONARY: No shortness of breath, cough or wheeze. GASTROINTESTINAL: No abdominal pain, nausea, vomiting or diarrhea, melena or bright red blood per rectum. GENITOURINARY: No urinary frequency, urgency, hesitancy or dysuria. MUSCULOSKELETAL: No joint or muscle pain, no back pain, no recent trauma. DERMATOLOGIC: No rash, no itching, no lesions. ENDOCRINE: No polyuria, polydipsia, no heat or cold intolerance. No recent  change in weight. HEMATOLOGICAL: No anemia or easy bruising or bleeding. NEUROLOGIC: No headache, seizures, numbness, tingling or weakness. PSYCHIATRIC: No depression, no loss of interest in normal activity or change in sleep pattern.     Exam:   BP 130/80   Ht 4' 10.75" (1.492 m)   Wt 184 lb (83.5 kg)   LMP 05/26/2018   BMI 37.48 kg/m   Body mass index is 37.48 kg/m.  General appearance : Well developed well nourished female. No acute distress HEENT: Eyes: no retinal hemorrhage or exudates,  Neck supple, trachea midline, no carotid bruits, no thyroidmegaly Lungs: Clear to auscultation, no rhonchi or wheezes, or rib retractions  Heart: Regular rate and rhythm, no murmurs or gallops Breast:Examined in sitting and supine position were symmetrical in appearance, no palpable masses or tenderness,  no skin retraction, no nipple inversion, no nipple discharge, no skin discoloration, no axillary or supraclavicular lymphadenopathy Abdomen: no palpable masses or tenderness, no rebound or guarding Extremities: no edema or skin discoloration or tenderness  Pelvic: Vulva: Normal             Vagina: No gross lesions or discharge  Cervix: No gross lesions or discharge  Uterus  AV, normal size, shape and consistency, non-tender and mobile  Adnexa  Without masses or tenderness  Anus: Normal   Assessment/Plan:  47 y.o. female for annual exam   1. Well female exam with routine gynecological exam Normal gynecologic exam.  Pap test negative with negative high-risk HPV  in August 2018.  Will repeat Pap test every 2 years.  Breast exam normal.  Screening mammogram was -February 2019.  Health labs here today. - CBC - Comp Met (CMET) - Lipid panel - TSH - VITAMIN D 25 Hydroxy (Vit-D Deficiency, Fractures)  2. Encounter for surveillance of contraceptive pills Well on Aviane birth control pills.  No contraindication to continue.  Prescription sent to pharmacy.  3. Urinary frequency Urine analysis  abnormal.  Decision to treat with ciprofloxacin.  1 tablet twice a day for 7 days.  Prescription sent to pharmacy.  Pending urine culture. - Urinalysis,Complete w/RFL Culture  4. Recurrent cystitis Postcoital cystitis.  Macrobid 1 tablet postcoitally for prophylaxis.  Prescription sent to pharmacy.  5. Class 2 obesity due to excess calories without serious comorbidity with body mass index (BMI) of 37.0 to 37.9 in adult Recommend lower calories/lower carbs nutrition such as [employer].  Increase physical activity with aerobic physical activity 5 times a week and weightlifting every 2 days.  Other orders - gabapentin (NEURONTIN) 100 MG capsule; Take 100 mg by mouth 3 (three) times daily. - levonorgestrel-ethinyl estradiol (AVIANE,ALESSE,LESSINA) 0.1-20 MG-MCG tablet; Take 1 tablet by mouth daily. - nitrofurantoin, macrocrystal-monohydrate, (MACROBID) 100 MG capsule; Take 1 capsule (100 mg total) by mouth once for 1 dose. Postcoital prophylaxis  Counseling on above issues and coordination of care more than 50% for 10 minutes.  Princess Bruins MD, 9:33 AM 06/16/2018

## 2018-06-16 NOTE — Patient Instructions (Signed)
1. Well female exam with routine gynecological exam Normal gynecologic exam.  Pap test negative with negative high-risk HPV in August 2018.  Will repeat Pap test every 2 years.  Breast exam normal.  Screening mammogram was -February 2019.  Health labs here today. - CBC - Comp Met (CMET) - Lipid panel - TSH - VITAMIN D 25 Hydroxy (Vit-D Deficiency, Fractures)  2. Encounter for surveillance of contraceptive pills Well on Aviane birth control pills.  No contraindication to continue.  Prescription sent to pharmacy.  3. Urinary frequency Urine analysis abnormal.  Decision to treat with ciprofloxacin.  1 tablet twice a day for 7 days.  Prescription sent to pharmacy.  Pending urine culture. - Urinalysis,Complete w/RFL Culture  4. Recurrent cystitis Postcoital cystitis.  Macrobid 1 tablet postcoitally for prophylaxis.  Prescription sent to pharmacy.  5. Class 2 obesity due to excess calories without serious comorbidity with body mass index (BMI) of 37.0 to 37.9 in adult Recommend lower calories/lower carbs nutrition such as [employer].  Increase physical activity with aerobic physical activity 5 times a week and weightlifting every 2 days.  Other orders - gabapentin (NEURONTIN) 100 MG capsule; Take 100 mg by mouth 3 (three) times daily. - levonorgestrel-ethinyl estradiol (AVIANE,ALESSE,LESSINA) 0.1-20 MG-MCG tablet; Take 1 tablet by mouth daily. - nitrofurantoin, macrocrystal-monohydrate, (MACROBID) 100 MG capsule; Take 1 capsule (100 mg total) by mouth once for 1 dose. Postcoital prophylaxis  Helon, it was a pleasure seeing you today!  I will inform you of your results as soon as they are available.

## 2018-06-17 ENCOUNTER — Telehealth: Payer: Self-pay

## 2018-06-17 ENCOUNTER — Other Ambulatory Visit: Payer: Self-pay | Admitting: Obstetrics & Gynecology

## 2018-06-17 LAB — VITAMIN D 25 HYDROXY (VIT D DEFICIENCY, FRACTURES): VIT D 25 HYDROXY: 14 ng/mL — AB (ref 30–100)

## 2018-06-17 LAB — LIPID PANEL
CHOL/HDL RATIO: 3.2 (calc) (ref <5.0)
CHOLESTEROL: 162 mg/dL (ref <200)
HDL: 50 mg/dL — ABNORMAL LOW (ref >50)
LDL CHOLESTEROL (CALC): 93 mg/dL
NON-HDL CHOLESTEROL (CALC): 112 mg/dL (ref <130)
Triglycerides: 94 mg/dL (ref <150)

## 2018-06-17 LAB — CBC
HEMATOCRIT: 42.3 % (ref 35.0–45.0)
HEMOGLOBIN: 14.4 g/dL (ref 11.7–15.5)
MCH: 31.5 pg (ref 27.0–33.0)
MCHC: 34 g/dL (ref 32.0–36.0)
MCV: 92.6 fL (ref 80.0–100.0)
MPV: 11 fL (ref 7.5–12.5)
Platelets: 261 10*3/uL (ref 140–400)
RBC: 4.57 10*6/uL (ref 3.80–5.10)
RDW: 11.9 % (ref 11.0–15.0)
WBC: 6.2 10*3/uL (ref 3.8–10.8)

## 2018-06-17 LAB — COMPREHENSIVE METABOLIC PANEL
AG Ratio: 1.4 (calc) (ref 1.0–2.5)
ALBUMIN MSPROF: 4 g/dL (ref 3.6–5.1)
ALKALINE PHOSPHATASE (APISO): 64 U/L (ref 33–115)
ALT: 10 U/L (ref 6–29)
AST: 10 U/L (ref 10–35)
BUN: 14 mg/dL (ref 7–25)
CHLORIDE: 104 mmol/L (ref 98–110)
CO2: 25 mmol/L (ref 20–32)
CREATININE: 0.69 mg/dL (ref 0.50–1.10)
Calcium: 9.3 mg/dL (ref 8.6–10.2)
GLOBULIN: 2.8 g/dL (ref 1.9–3.7)
Glucose, Bld: 89 mg/dL (ref 65–99)
POTASSIUM: 4.2 mmol/L (ref 3.5–5.3)
SODIUM: 138 mmol/L (ref 135–146)
TOTAL PROTEIN: 6.8 g/dL (ref 6.1–8.1)
Total Bilirubin: 0.5 mg/dL (ref 0.2–1.2)

## 2018-06-17 LAB — TSH: TSH: 1.09 mIU/L

## 2018-06-17 MED ORDER — NITROFURANTOIN MONOHYD MACRO 100 MG PO CAPS: ORAL_CAPSULE | ORAL | 1 refills | Status: DC

## 2018-06-17 MED ORDER — CIPROFLOXACIN HCL 250 MG PO TABS: 250.0000 mg | ORAL_TABLET | Freq: Two times a day (BID) | ORAL | 0 refills | Status: DC

## 2018-06-17 NOTE — Addendum Note (Signed)
Addended by: Keenan Bachelor on: 06/17/2018 03:42 PM   Modules accepted: Orders

## 2018-06-17 NOTE — Telephone Encounter (Addendum)
Patient was seen yesterday and informed that Macrobid Rx would be sent to pharmacy but she went there and they did not have it.  I called her to let her know.She said this was one but that her u/a actually showed infection and Dr. Mackey Birchwood was to send a Rx for UTI current.  I do see in chart where Dr. Mackey Birchwood wrote:  . Urinary frequency Urine analysis abnormal.  Decision to treat with ciprofloxacin.  1 tablet twice a day for 7 days.  Prescription sent to pharmacy.  Pending urine culture. - Urinalysis,Complete w/RFL Culture  I sent Rx for Cipro 250mg  bid x 7 days.  Apology extended to patient.

## 2018-06-18 LAB — URINALYSIS, COMPLETE W/RFL CULTURE
BILIRUBIN URINE: NEGATIVE
GLUCOSE, UA: NEGATIVE
HYALINE CAST: NONE SEEN /LPF
KETONES UR: NEGATIVE
LEUKOCYTE ESTERASE: NEGATIVE
Nitrites, Initial: NEGATIVE
PH: 5.5 (ref 5.0–8.0)
PROTEIN: NEGATIVE
SPECIFIC GRAVITY, URINE: 1.025 (ref 1.001–1.03)

## 2018-06-18 LAB — CULTURE INDICATED

## 2018-06-18 LAB — URINE CULTURE
MICRO NUMBER:: 91243724
Result:: NO GROWTH
SPECIMEN QUALITY: ADEQUATE

## 2018-06-22 ENCOUNTER — Other Ambulatory Visit: Payer: Self-pay | Admitting: Obstetrics & Gynecology

## 2018-06-22 DIAGNOSIS — E559 Vitamin D deficiency, unspecified: Secondary | ICD-10-CM

## 2018-06-22 MED ORDER — VITAMIN D (ERGOCALCIFEROL) 1.25 MG (50000 UNIT) PO CAPS: 50000.0000 [IU] | ORAL_CAPSULE | ORAL | 0 refills | Status: DC

## 2018-09-17 ENCOUNTER — Other Ambulatory Visit: Payer: Managed Care, Other (non HMO)

## 2018-09-17 DIAGNOSIS — E559 Vitamin D deficiency, unspecified: Secondary | ICD-10-CM

## 2018-09-18 LAB — VITAMIN D 25 HYDROXY (VIT D DEFICIENCY, FRACTURES): VIT D 25 HYDROXY: 31 ng/mL (ref 30–100)
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
## 2019-07-08 ENCOUNTER — Other Ambulatory Visit: Payer: Self-pay

## 2019-07-08 ENCOUNTER — Other Ambulatory Visit: Payer: Self-pay | Admitting: Obstetrics & Gynecology

## 2019-07-09 ENCOUNTER — Encounter: Payer: Self-pay | Admitting: Obstetrics & Gynecology

## 2019-07-09 ENCOUNTER — Ambulatory Visit (INDEPENDENT_AMBULATORY_CARE_PROVIDER_SITE_OTHER): Payer: Managed Care, Other (non HMO) | Admitting: Obstetrics & Gynecology

## 2019-07-09 VITALS — BP 128/84 | Ht 58.5 in | Wt 196.5 lb

## 2019-07-09 DIAGNOSIS — Z23 Encounter for immunization: Secondary | ICD-10-CM

## 2019-07-09 DIAGNOSIS — Z01419 Encounter for gynecological examination (general) (routine) without abnormal findings: Secondary | ICD-10-CM | POA: Diagnosis not present

## 2019-07-09 DIAGNOSIS — E559 Vitamin D deficiency, unspecified: Secondary | ICD-10-CM | POA: Diagnosis not present

## 2019-07-09 DIAGNOSIS — Z3041 Encounter for surveillance of contraceptive pills: Secondary | ICD-10-CM | POA: Diagnosis not present

## 2019-07-09 DIAGNOSIS — Z6841 Body Mass Index (BMI) 40.0 and over, adult: Secondary | ICD-10-CM

## 2019-07-09 MED ORDER — LEVONORGESTREL-ETHINYL ESTRAD 0.1-20 MG-MCG PO TABS: 1.0000 | ORAL_TABLET | Freq: Every day | ORAL | 4 refills | Status: DC

## 2019-07-09 NOTE — Progress Notes (Signed)
Destiny Rios [birth year] [id number]   History:    48 y.o. G2P2L2 Married  RP:  Established patient presenting for annual gyn exam   HPI: Well on Aviane BCPs.  No breakthrough bleeding.  No pelvic pain.  No pain with intercourse.  Followed by Urologist for tendency for postcoital cystitis on prophylaxis and treatment for urinary urgency.  No pain with urination and no blood in urine.  No fever.  Bowel movements normal.  Breasts normal.  Body mass index 40.37.  Needs to increase physical activity.  Will do fasting health labs here today.  Past medical history,surgical history, family history and social history were all reviewed and documented in the EPIC chart.  Gynecologic History Patient's last menstrual period was 06/18/2019. Contraception: OCP (estrogen/progesterone) Last Pap: 04/2017. Results were: Negative/HPV HR neg.  No TZ cell present. Last mammogram: 03/2019. Results were: normal per patient Bone Density: Never Colonoscopy: Never  Obstetric History OB History  Gravida Para Term Preterm AB Living  '2 2 2     2  ' SAB TAB Ectopic Multiple Live Births          2    # Outcome Date GA Lbr Len/2nd Weight Sex Delivery Anes PTL Lv  2 Term     F Vag-Spont  N LIV  1 Term     M Vag-Spont  N LIV     ROS: A ROS was performed and pertinent positives and negatives are included in the history.  GENERAL: No fevers or chills. HEENT: No change in vision, no earache, sore throat or sinus congestion. NECK: No pain or stiffness. CARDIOVASCULAR: No chest pain or pressure. No palpitations. PULMONARY: No shortness of breath, cough or wheeze. GASTROINTESTINAL: No abdominal pain, nausea, vomiting or diarrhea, melena or bright red blood per rectum. GENITOURINARY: No urinary frequency, urgency, hesitancy or dysuria. MUSCULOSKELETAL: No joint or muscle pain, no back pain, no recent trauma. DERMATOLOGIC: No rash, no itching, no lesions. ENDOCRINE: No polyuria, polydipsia, no heat or cold intolerance. No  recent change in weight. HEMATOLOGICAL: No anemia or easy bruising or bleeding. NEUROLOGIC: No headache, seizures, numbness, tingling or weakness. PSYCHIATRIC: No depression, no loss of interest in normal activity or change in sleep pattern.     Exam:   BP 128/84   Ht 4' 10.5" (1.486 m)   Wt 196 lb 8 oz (89.1 kg)   LMP 06/18/2019   BMI 40.37 kg/m   Body mass index is 40.37 kg/m.  General appearance : Well developed well nourished female. No acute distress HEENT: Eyes: no retinal hemorrhage or exudates,  Neck supple, trachea midline, no carotid bruits, no thyroidmegaly Lungs: Clear to auscultation, no rhonchi or wheezes, or rib retractions  Heart: Regular rate and rhythm, no murmurs or gallops Breast:Examined in sitting and supine position were symmetrical in appearance, no palpable masses or tenderness,  no skin retraction, no nipple inversion, no nipple discharge, no skin discoloration, no axillary or supraclavicular lymphadenopathy Abdomen: no palpable masses or tenderness, no rebound or guarding Extremities: no edema or skin discoloration or tenderness  Pelvic: Vulva: Normal             Vagina: No gross lesions or discharge  Cervix: No gross lesions or discharge.  Pap reflex done.  Uterus  AV, normal size, shape and consistency, non-tender and mobile  Adnexa  Without masses or tenderness  Anus: Normal   Assessment/Plan:  48 y.o. female for annual exam   1. Encounter for routine gynecological examination with Papanicolaou smear  of cervix Normal gynecologic exam.  Pap reflex done.  Breast exam normal.  Screening mammogram July 2020 was normal per patient, will obtain report.  Fasting health labs here today. - CBC - Comp Met (CMET) - TSH - Lipid panel  2. Encounter for surveillance of contraceptive pills Well on Aviane.  No contraindication to birth control pills.  Prescription sent to pharmacy.  3. Vitamin D deficiency - VITAMIN D 25 Hydroxy (Vit-D Deficiency, Fractures)   4. Class 3 severe obesity due to excess calories without serious comorbidity with body mass index (BMI) of 40.0 to 44.9 in adult [facility]) Recommend a lower calorie/carb diet such as [employer].  Aerobic physical activities 5 times a week and weightlifting every 2 days.  5. Need for immunization against influenza - Flu Vaccine QUAD 36+ mos IM  Other orders - cholecalciferol (VITAMIN D3) 25 MCG (1000 UT) tablet; Take 1,000 Units by mouth daily. - sulfamethoxazole-trimethoprim (BACTRIM) 400-80 MG tablet; Take 1 tablet by mouth once. After intercourse - levonorgestrel-ethinyl estradiol (ALESSE) 0.1-20 MG-MCG tablet; Take 1 tablet by mouth daily.  Princess Bruins MD, 9:49 AM 07/09/2019

## 2019-07-10 ENCOUNTER — Encounter: Payer: Self-pay | Admitting: Obstetrics & Gynecology

## 2019-07-10 NOTE — Patient Instructions (Signed)
1. Encounter for routine gynecological examination with Papanicolaou smear of cervix Normal gynecologic exam.  Pap reflex done.  Breast exam normal.  Screening mammogram July 2020 was normal per patient, will obtain report.  Fasting health labs here today. - CBC - Comp Met (CMET) - TSH - Lipid panel  2. Encounter for surveillance of contraceptive pills Well on Aviane.  No contraindication to birth control pills.  Prescription sent to pharmacy.  3. Vitamin D deficiency - VITAMIN D 25 Hydroxy (Vit-D Deficiency, Fractures)  4. Class 3 severe obesity due to excess calories without serious comorbidity with body mass index (BMI) of 40.0 to 44.9 in adult [facility]) Recommend a lower calorie/carb diet such as [employer].  Aerobic physical activities 5 times a week and weightlifting every 2 days.  5. Need for immunization against influenza - Flu Vaccine QUAD 36+ mos IM  Other orders - cholecalciferol (VITAMIN D3) 25 MCG (1000 UT) tablet; Take 1,000 Units by mouth daily. - sulfamethoxazole-trimethoprim (BACTRIM) 400-80 MG tablet; Take 1 tablet by mouth once. After intercourse - levonorgestrel-ethinyl estradiol (ALESSE) 0.1-20 MG-MCG tablet; Take 1 tablet by mouth daily.  Destiny Rios, it was a pleasure seeing you today!  I will inform you of your results as soon as they are available.

## 2019-07-12 LAB — PAP IG W/ RFLX HPV ASCU

## 2019-07-14 LAB — LIPID PANEL
Cholesterol: 163 mg/dL (ref <200)
HDL: 47 mg/dL — ABNORMAL LOW (ref >=50)
LDL Cholesterol (Calc): 91 mg/dL (calc)
Non-HDL Cholesterol (Calc): 116 mg/dL (calc) (ref <130)
Total CHOL/HDL Ratio: 3.5 (calc) (ref <5.0)
Triglycerides: 146 mg/dL (ref <150)

## 2019-07-14 LAB — TEST AUTHORIZATION

## 2019-07-14 LAB — COMPREHENSIVE METABOLIC PANEL
AG Ratio: 1.4 (calc) (ref 1.0–2.5)
ALT: 13 U/L (ref 6–29)
AST: 11 U/L (ref 10–35)
Albumin: 3.9 g/dL (ref 3.6–5.1)
Alkaline phosphatase (APISO): 67 U/L (ref 31–125)
BUN: 12 mg/dL (ref 7–25)
CO2: 23 mmol/L (ref 20–32)
Calcium: 9.2 mg/dL (ref 8.6–10.2)
Chloride: 108 mmol/L (ref 98–110)
Creat: 0.69 mg/dL (ref 0.50–1.10)
Globulin: 2.7 g/dL (calc) (ref 1.9–3.7)
Glucose, Bld: 100 mg/dL — ABNORMAL HIGH (ref 65–99)
Potassium: 4.1 mmol/L (ref 3.5–5.3)
Sodium: 140 mmol/L (ref 135–146)
Total Bilirubin: 0.5 mg/dL (ref 0.2–1.2)
Total Protein: 6.6 g/dL (ref 6.1–8.1)

## 2019-07-14 LAB — CBC
HCT: 43.2 % (ref 35.0–45.0)
Hemoglobin: 14.5 g/dL (ref 11.7–15.5)
MCH: 31.9 pg (ref 27.0–33.0)
MCHC: 33.6 g/dL (ref 32.0–36.0)
MCV: 94.9 fL (ref 80.0–100.0)
MPV: 11 fL (ref 7.5–12.5)
Platelets: 241 10*3/uL (ref 140–400)
RBC: 4.55 10*6/uL (ref 3.80–5.10)
RDW: 12 % (ref 11.0–15.0)
WBC: 7.5 10*3/uL (ref 3.8–10.8)

## 2019-07-14 LAB — VITAMIN D 25 HYDROXY (VIT D DEFICIENCY, FRACTURES): Vit D, 25-Hydroxy: 33 ng/mL (ref 30–100)

## 2019-07-14 LAB — TSH: TSH: 1.34 mIU/L

## 2019-07-14 LAB — HEMOGLOBIN A1C W/OUT EAG: Hgb A1c MFr Bld: 5.6 % of total Hgb (ref <5.7)

## 2019-07-19 ENCOUNTER — Other Ambulatory Visit: Payer: Managed Care, Other (non HMO)

## 2019-09-30 ENCOUNTER — Other Ambulatory Visit: Payer: Self-pay | Admitting: Obstetrics & Gynecology

## 2020-08-31 ENCOUNTER — Other Ambulatory Visit: Payer: Self-pay | Admitting: Obstetrics & Gynecology

## 2020-09-28 ENCOUNTER — Other Ambulatory Visit: Payer: Self-pay

## 2020-09-28 ENCOUNTER — Encounter: Payer: Self-pay | Admitting: Obstetrics & Gynecology

## 2020-09-28 ENCOUNTER — Ambulatory Visit (INDEPENDENT_AMBULATORY_CARE_PROVIDER_SITE_OTHER): Payer: Managed Care, Other (non HMO) | Admitting: Obstetrics & Gynecology

## 2020-09-28 VITALS — BP 128/90 | HR 88 | Resp 18 | Ht 58.86 in | Wt 198.6 lb

## 2020-09-28 DIAGNOSIS — Z3041 Encounter for surveillance of contraceptive pills: Secondary | ICD-10-CM | POA: Diagnosis not present

## 2020-09-28 DIAGNOSIS — Z01419 Encounter for gynecological examination (general) (routine) without abnormal findings: Secondary | ICD-10-CM | POA: Diagnosis not present

## 2020-09-28 DIAGNOSIS — N3090 Cystitis, unspecified without hematuria: Secondary | ICD-10-CM

## 2020-09-28 DIAGNOSIS — R350 Frequency of micturition: Secondary | ICD-10-CM | POA: Diagnosis not present

## 2020-09-28 DIAGNOSIS — Z6841 Body Mass Index (BMI) 40.0 and over, adult: Secondary | ICD-10-CM

## 2020-09-28 MED ORDER — SULFAMETHOXAZOLE-TRIMETHOPRIM 800-160 MG PO TABS
1.0000 | ORAL_TABLET | Freq: Two times a day (BID) | ORAL | 0 refills | Status: AC
Start: 2020-09-28 — End: 2020-10-01

## 2020-09-28 MED ORDER — LEVONORGESTREL-ETHINYL ESTRAD 0.1-20 MG-MCG PO TABS: 1.0000 | ORAL_TABLET | Freq: Every day | ORAL | 4 refills | Status: DC

## 2020-09-28 MED ORDER — SULFAMETHOXAZOLE-TRIMETHOPRIM 400-80 MG PO TABS: 1.0000 | ORAL_TABLET | ORAL | 3 refills | Status: DC | PRN

## 2020-09-28 NOTE — Progress Notes (Signed)
Destiny Rios [birth year] [id number]   History:    50 y.o. G2P2L2 Married  RP:  Established patient presenting for annual gyn exam   SHF:WYOV on Aviane BCPs.No breakthrough bleeding. No pelvic pain. No pain with intercourse. Followed by Urologist for tendency for postcoital cystitis on prophylaxis and treatment for urinary urgency. No pain with urination and no blood in urine. No fever. Bowel movements normal. Breasts normal. Body mass index 40.31. Needs to increase physical activity. Will do fasting health labs here today.  Past medical history,surgical history, family history and social history were all reviewed and documented in the EPIC chart.  Gynecologic History Patient's last menstrual period was 08/28/2020 (approximate).  Obstetric History OB History  Gravida Para Term Preterm AB Living  '2 2 2     2  ' SAB IAB Ectopic Multiple Live Births          2    # Outcome Date GA Lbr Len/2nd Weight Sex Delivery Anes PTL Lv  2 Term     F Vag-Spont  N LIV  1 Term     M Vag-Spont  N LIV     ROS: A ROS was performed and pertinent positives and negatives are included in the history.  GENERAL: No fevers or chills. HEENT: No change in vision, no earache, sore throat or sinus congestion. NECK: No pain or stiffness. CARDIOVASCULAR: No chest pain or pressure. No palpitations. PULMONARY: No shortness of breath, cough or wheeze. GASTROINTESTINAL: No abdominal pain, nausea, vomiting or diarrhea, melena or bright red blood per rectum. GENITOURINARY: No urinary frequency, urgency, hesitancy or dysuria. MUSCULOSKELETAL: No joint or muscle pain, no back pain, no recent trauma. DERMATOLOGIC: No rash, no itching, no lesions. ENDOCRINE: No polyuria, polydipsia, no heat or cold intolerance. No recent change in weight. HEMATOLOGICAL: No anemia or easy bruising or bleeding. NEUROLOGIC: No headache, seizures, numbness, tingling or weakness. PSYCHIATRIC: No depression, no loss of interest in  normal activity or change in sleep pattern.     Exam:   BP 128/90 (BP Location: Right Arm, Patient Position: Sitting)    Pulse 88    Resp 18    Ht 4' 10.86" (1.495 m)    Wt 198 lb 9.6 oz (90.1 kg)    LMP 08/28/2020 (Approximate)    BMI 40.31 kg/m   Body mass index is 40.31 kg/m.  General appearance : Well developed well nourished female. No acute distress HEENT: Eyes: no retinal hemorrhage or exudates,  Neck supple, trachea midline, no carotid bruits, no thyroidmegaly Lungs: Clear to auscultation, no rhonchi or wheezes, or rib retractions  Heart: Regular rate and rhythm, no murmurs or gallops Breast:Examined in sitting and supine position were symmetrical in appearance, no palpable masses or tenderness,  no skin retraction, no nipple inversion, no nipple discharge, no skin discoloration, no axillary or supraclavicular lymphadenopathy Abdomen: no palpable masses or tenderness, no rebound or guarding Extremities: no edema or skin discoloration or tenderness  Pelvic: Vulva: Normal             Vagina: No gross lesions or discharge  Cervix: No gross lesions or discharge.  Pap reflex done.  Uterus AV, normal size, shape and consistency, non-tender and mobile  Adnexa  Without masses or tenderness  Anus: Normal  U/A: Yellow cloudy, nitrite negative, protein negative, white blood cells 0-5, red blood cells 3-10, bacteria few.  Pending urine culture.   Assessment/Plan:  50 y.o. female for annual exam   1. Encounter for routine gynecological examination with  Papanicolaou smear of cervix Normal gynecologic exam.  Pap reflex done.  Breast exam normal.  Screening mammogram August 2021 was negative.  Fasting health labs here today. - CBC - Comp Met (CMET) - Lipid Profile - TSH - Vitamin D 1,25 dihydroxy  2. Encounter for surveillance of contraceptive pills Well on the least.  No contraindication to continue.  Prescription sent to pharmacy.  3. Urinary frequency Probable acute cystitis.   Decision to treat with Bactrim DS.  Usage reviewed and prescription sent to pharmacy. - Urinalysis,Complete w/RFL Culture  4. Recurrent cystitis History of recurrent cystitis post totally.  Will use Bactrim 1 tablet before intercourse or just after.  Prescription sent to pharmacy.  5. Class 3 severe obesity due to excess calories without serious comorbidity with body mass index (BMI) of 40.0 to 44.9 in adult [facility]) Recommend a lower calorie/carb diet.  Aerobic activities 5 times a week and light weightlifting every 2 days.  Other orders - sulfamethoxazole-trimethoprim (BACTRIM) 400-80 MG tablet; Take 1 tablet by mouth as needed. Just before or after intercourse - levonorgestrel-ethinyl estradiol (ALESSE) 0.1-20 MG-MCG tablet; Take 1 tablet by mouth daily. - sulfamethoxazole-trimethoprim (BACTRIM DS) 800-160 MG tablet; Take 1 tablet by mouth 2 (two) times daily for 3 days. - Urine Culture - REFLEXIVE URINE CULTURE Other orders - sulfamethoxazole-trimethoprim (BACTRIM) 400-80 MG tablet; Take 1 tablet by mouth as needed. Just before or after intercourse - levonorgestrel-ethinyl estradiol (ALESSE) 0.1-20 MG-MCG tablet; Take 1 tablet by mouth daily. - sulfamethoxazole-trimethoprim (BACTRIM DS) 800-160 MG tablet; Take 1 tablet by mouth 2 (two) times daily for 3 days.  Princess Bruins MD, 9:32 AM 09/28/2020

## 2020-09-29 ENCOUNTER — Encounter: Payer: Self-pay | Admitting: Obstetrics & Gynecology

## 2020-09-29 LAB — PAP IG W/ RFLX HPV ASCU

## 2020-10-01 LAB — URINALYSIS, COMPLETE W/RFL CULTURE
Bilirubin Urine: NEGATIVE
Glucose, UA: NEGATIVE
Hyaline Cast: NONE SEEN /LPF
Ketones, ur: NEGATIVE
Leukocyte Esterase: NEGATIVE
Nitrites, Initial: NEGATIVE
Protein, ur: NEGATIVE
Specific Gravity, Urine: 1.029 (ref 1.001–1.03)
pH: 5.5 (ref 5.0–8.0)

## 2020-10-01 LAB — URINE CULTURE
MICRO NUMBER:: 11464744
SPECIMEN QUALITY:: ADEQUATE

## 2020-10-01 LAB — CULTURE INDICATED

## 2020-10-04 LAB — COMPREHENSIVE METABOLIC PANEL
AG Ratio: 1.7 (calc) (ref 1.0–2.5)
ALT: 11 U/L (ref 6–29)
AST: 9 U/L — ABNORMAL LOW (ref 10–35)
Albumin: 3.9 g/dL (ref 3.6–5.1)
Alkaline phosphatase (APISO): 60 U/L (ref 31–125)
BUN: 13 mg/dL (ref 7–25)
CO2: 24 mmol/L (ref 20–32)
Calcium: 9 mg/dL (ref 8.6–10.2)
Chloride: 105 mmol/L (ref 98–110)
Creat: 0.59 mg/dL (ref 0.50–1.10)
Globulin: 2.3 g/dL (calc) (ref 1.9–3.7)
Glucose, Bld: 95 mg/dL (ref 65–99)
Potassium: 3.9 mmol/L (ref 3.5–5.3)
Sodium: 137 mmol/L (ref 135–146)
Total Bilirubin: 0.6 mg/dL (ref 0.2–1.2)
Total Protein: 6.2 g/dL (ref 6.1–8.1)

## 2020-10-04 LAB — CBC
HCT: 42.2 % (ref 35.0–45.0)
Hemoglobin: 14.3 g/dL (ref 11.7–15.5)
MCH: 31.7 pg (ref 27.0–33.0)
MCHC: 33.9 g/dL (ref 32.0–36.0)
MCV: 93.6 fL (ref 80.0–100.0)
MPV: 11 fL (ref 7.5–12.5)
Platelets: 229 10*3/uL (ref 140–400)
RBC: 4.51 10*6/uL (ref 3.80–5.10)
RDW: 11.9 % (ref 11.0–15.0)
WBC: 11.9 10*3/uL — ABNORMAL HIGH (ref 3.8–10.8)

## 2020-10-04 LAB — TSH: TSH: 0.47 mIU/L

## 2020-10-04 LAB — LIPID PANEL
Cholesterol: 155 mg/dL (ref <200)
HDL: 44 mg/dL — ABNORMAL LOW (ref >=50)
LDL Cholesterol (Calc): 92 mg/dL (calc)
Non-HDL Cholesterol (Calc): 111 mg/dL (calc) (ref <130)
Total CHOL/HDL Ratio: 3.5 (calc) (ref <5.0)
Triglycerides: 99 mg/dL (ref <150)

## 2020-10-04 LAB — VITAMIN D 1,25 DIHYDROXY
Vitamin D 1, 25 (OH)2 Total: 46 pg/mL (ref 18–72)
Vitamin D2 1, 25 (OH)2: 31 pg/mL
Vitamin D3 1, 25 (OH)2: 15 pg/mL

## 2021-06-12 LAB — HM COLONOSCOPY

## 2021-12-10 ENCOUNTER — Other Ambulatory Visit: Payer: Self-pay | Admitting: Obstetrics & Gynecology

## 2021-12-10 NOTE — Telephone Encounter (Signed)
Last AEX 09/28/20--scheduled for 12/20/21. ?Last mammo 10/11/21.  ?

## 2021-12-20 ENCOUNTER — Ambulatory Visit (INDEPENDENT_AMBULATORY_CARE_PROVIDER_SITE_OTHER): Payer: 59 | Admitting: Obstetrics & Gynecology

## 2021-12-20 ENCOUNTER — Encounter: Payer: Self-pay | Admitting: Obstetrics & Gynecology

## 2021-12-20 VITALS — BP 118/80 | HR 74 | Resp 16 | Ht 58.75 in | Wt 193.0 lb

## 2021-12-20 DIAGNOSIS — E6609 Other obesity due to excess calories: Secondary | ICD-10-CM | POA: Diagnosis not present

## 2021-12-20 DIAGNOSIS — N3090 Cystitis, unspecified without hematuria: Secondary | ICD-10-CM

## 2021-12-20 DIAGNOSIS — Z3041 Encounter for surveillance of contraceptive pills: Secondary | ICD-10-CM

## 2021-12-20 DIAGNOSIS — Z01419 Encounter for gynecological examination (general) (routine) without abnormal findings: Secondary | ICD-10-CM

## 2021-12-20 DIAGNOSIS — Z6839 Body mass index (BMI) 39.0-39.9, adult: Secondary | ICD-10-CM

## 2021-12-20 MED ORDER — LEVONORGESTREL-ETHINYL ESTRAD 0.1-20 MG-MCG PO TABS: 1.0000 | ORAL_TABLET | Freq: Every day | ORAL | 4 refills | Status: DC

## 2021-12-20 MED ORDER — SULFAMETHOXAZOLE-TRIMETHOPRIM 400-80 MG PO TABS: 1.0000 | ORAL_TABLET | ORAL | 5 refills | Status: DC | PRN

## 2021-12-20 NOTE — Progress Notes (Signed)
? ? ?Destiny Rios [birth year] [id number] ? ? ?History:    51 y.o.  G2P2L2 Married ?  ?RP:  Established patient presenting for annual gyn exam  ?  ?HPI: Well on Aviane BCPs.  No breakthrough bleeding.  No pelvic pain.  Pap Neg 09/2020.  No previous abnormal Pap.  Will repeat Pap at 3 years.  No pain with intercourse.  Followed by Urologist for tendency for postcoital cystitis on prophylaxis and treatment for urinary urgency.  No pain with urination and no blood in urine.  No fever.  Bowel movements normal.  Breasts normal.  Mammo Neg 10/2021.  Body mass index 39.31.  Continue low carb/calorie diet.  Needs to increase physical activity.  Health labs with [facility].  COLONOSCOPY: 2022 ? ?Past medical history,surgical history, family history and social history were all reviewed and documented in the EPIC chart. ? ?Gynecologic History ?Patient's last menstrual period was 12/06/2021 (exact date). ? ?Obstetric History ?OB History  ?Gravida Para Term Preterm AB Living  ?2 2 2     2   ?SAB IAB Ectopic Multiple Live Births  ?        2  ?  ?# Outcome Date GA Lbr Len/2nd Weight Sex Delivery Anes PTL Lv  ?2 Term     F Vag-Spont  N LIV  ?1 Term     M Vag-Spont  N LIV  ? ? ? ?ROS: A ROS was performed and pertinent positives and negatives are included in the history. ? GENERAL: No fevers or chills. HEENT: No change in vision, no earache, sore throat or sinus congestion. NECK: No pain or stiffness. CARDIOVASCULAR: No chest pain or pressure. No palpitations. PULMONARY: No shortness of breath, cough or wheeze. GASTROINTESTINAL: No abdominal pain, nausea, vomiting or diarrhea, melena or bright red blood per rectum. GENITOURINARY: No urinary frequency, urgency, hesitancy or dysuria. MUSCULOSKELETAL: No joint or muscle pain, no back pain, no recent trauma. DERMATOLOGIC: No rash, no itching, no lesions. ENDOCRINE: No polyuria, polydipsia, no heat or cold intolerance. No recent change in weight. HEMATOLOGICAL: No anemia or easy bruising or  bleeding. NEUROLOGIC: No headache, seizures, numbness, tingling or weakness. PSYCHIATRIC: No depression, no loss of interest in normal activity or change in sleep pattern.  ?  ? ?Exam: ? ? ?BP 118/80   Pulse 74   Resp 16   Ht 4' 10.75" (1.492 m)   Wt 193 lb (87.5 kg)   LMP 12/06/2021 (Exact Date) Comment: takes ocp  BMI 39.31 kg/m?  ? ?Body mass index is 39.31 kg/m?. ? ?General appearance : Well developed well nourished female. No acute distress ?HEENT: Eyes: no retinal hemorrhage or exudates,  Neck supple, trachea midline, no carotid bruits, no thyroidmegaly ?Lungs: Clear to auscultation, no rhonchi or wheezes, or rib retractions  ?Heart: Regular rate and rhythm, no murmurs or gallops ?Breast:Examined in sitting and supine position were symmetrical in appearance, no palpable masses or tenderness,  no skin retraction, no nipple inversion, no nipple discharge, no skin discoloration, no axillary or supraclavicular lymphadenopathy ?Abdomen: no palpable masses or tenderness, no rebound or guarding ?Extremities: no edema or skin discoloration or tenderness ? ?Pelvic: Vulva: Normal ?            Vagina: No gross lesions or discharge ? Cervix: No gross lesions or discharge ? Uterus  AV, normal size, shape and consistency, non-tender and mobile ? Adnexa  Without masses or tenderness ? Anus: Normal ? ? ?Assessment/Plan:  51 y.o. female for annual exam  ? ?1.  Well female exam with routine gynecological exam ?Well on Aviane BCPs.  No breakthrough bleeding.  No pelvic pain.  Pap Neg 09/2020.  No previous abnormal Pap.  Will repeat Pap at 3 years.  No pain with intercourse.  Seen by Urologist for tendency for postcoital cystitis on prophylaxis and treatment for urinary urgency.  No pain with urination and no blood in urine.  No fever.  Bowel movements normal.  Breasts normal.  Mammo Neg 10/2021.  Body mass index 39.31.  Continue low carb/calorie diet.  Needs to increase physical activity.  Health labs with [facility].   COLONOSCOPY: 2022 ? ?2. Encounter for surveillance of contraceptive pills ?Well on Aviane BCPs.  No breakthrough bleeding.  No pelvic pain. No CI to continue on BCPs.  Prescription sent to pharmacy. ? ?3. Recurrent cystitis ?Postcoital cystitis on prophylaxis and treatment for urinary urgency.  No pain with urination and no blood in urine.  No fever.  Bactrim DS prescription for prophylaxis sent to pharmacy. ? ?4. Class 2 obesity due to excess calories without serious comorbidity with body mass index (BMI) of 39.0 to 39.9 in adult ?Body mass index 39.31.  Continue low carb/calorie diet.  Needs to increase physical activity.  ? ?Other orders ?- cyclobenzaprine (FLEXERIL) 10 MG tablet; Take by mouth. ?- solifenacin (VESICARE) 5 MG tablet; Take 5 mg by mouth daily. ?- fluticasone (FLONASE) 50 MCG/ACT nasal spray; Place into the nose. ?- sulfamethoxazole-trimethoprim (BACTRIM) 400-80 MG tablet; Take 1 tablet by mouth as needed. Just before or after intercourse ?- levonorgestrel-ethinyl estradiol (ALESSE) 0.1-20 MG-MCG tablet; Take 1 tablet by mouth daily.  ? ?Genia Del MD, 10:54 AM 12/20/2021 ? ?  ?

## 2021-12-21 ENCOUNTER — Encounter: Payer: Self-pay | Admitting: Obstetrics & Gynecology

## 2022-03-05 ENCOUNTER — Encounter: Payer: Self-pay | Admitting: Obstetrics & Gynecology

## 2022-03-06 MED ORDER — LEVONORGESTREL-ETHINYL ESTRAD 0.1-20 MG-MCG PO TABS: 1.0000 | ORAL_TABLET | Freq: Every day | ORAL | 4 refills | Status: DC

## 2022-03-29 DIAGNOSIS — E559 Vitamin D deficiency, unspecified: Secondary | ICD-10-CM | POA: Insufficient documentation

## 2022-05-21 ENCOUNTER — Ambulatory Visit
Admission: EM | Admit: 2022-05-21 | Discharge: 2022-05-21 | Disposition: A | Discharge status: Home / Self Care | Payer: 59 | Attending: Family Medicine | Admitting: Family Medicine

## 2022-05-21 DIAGNOSIS — L0390 Cellulitis, unspecified: Secondary | ICD-10-CM | POA: Diagnosis not present

## 2022-05-21 DIAGNOSIS — B372 Candidiasis of skin and nail: Secondary | ICD-10-CM | POA: Diagnosis not present

## 2022-05-21 MED ORDER — NYSTATIN-TRIAMCINOLONE 100000-0.1 UNIT/GM-% EX CREA: TOPICAL_CREAM | CUTANEOUS | 0 refills | Status: DC

## 2022-05-21 MED ORDER — DOXYCYCLINE HYCLATE 100 MG PO CAPS: 100.0000 mg | ORAL_CAPSULE | Freq: Two times a day (BID) | ORAL | 0 refills | Status: AC

## 2022-05-21 NOTE — ED Triage Notes (Signed)
Pt presents with c/o redness and itching at an "old appendectomy scar". Pt states she noticed redness and itching 1 week ago. Pt states she had her appendix removed in childhood.

## 2022-05-21 NOTE — ED Provider Notes (Signed)
[facility]    CSN: [id number] Arrival date & time: 05/21/22  0808      History   Chief Complaint Chief Complaint  Patient presents with   abd itching    HPI Destiny Rios is a 51 y.o. female.   HPI Very pleasant 51 year old female presents with redness and itching at old appendectomy scar for 1 week.  Patient reports appendix removed when she was a child.  PMH significant for obesity, varicose veins of both lower extremities, and elevated blood pressure.  History reviewed. No pertinent past medical history.  Patient Active Problem List   Diagnosis Date Noted   Class 3 severe obesity due to excess calories without serious comorbidity with body mass index (BMI) of 40.0 to 44.9 in adult [facility]) 05/21/2022   Back spasm 04/04/2016   Varicose veins of both lower extremities 04/04/2016   Sinusitis, acute frontal 12/10/2012   Otitis media 12/10/2012   General medical examination 11/12/2011   Trapezius muscle spasm 10/01/2011   Hyperpigmentation of skin 10/01/2011   Chest pain 01/17/2011   Elevated BP 01/17/2011   CARPAL TUNNEL SYNDROME 11/07/2010   INGROWN TOENAIL, INFECTED 11/07/2010   EUSTACHIAN TUBE DYSFUNCTION 09/13/2010   DANDRUFF 09/13/2010   CHEST PAIN 05/15/2010   ABDOMINAL PAIN, LEFT LOWER QUADRANT, HX OF 11/06/2009   HEMORRHOIDS 10/19/2009   DYSPEPSIA 10/19/2009   ABDOMINAL PAIN OTHER SPECIFIED SITE 10/19/2009   RHINITIS 01/16/2009   ACUTE LYMPHADENITIS 01/02/2009    Past Surgical History:  Procedure Laterality Date   APPENDECTOMY      OB History     Gravida  2   Para  2   Term  2   Preterm      AB      Living  2      SAB      IAB      Ectopic      Multiple      Live Births  2            Home Medications    Prior to Admission medications   Medication Sig Start Date End Date Taking? Authorizing Provider  doxycycline (VIBRAMYCIN) 100 MG capsule Take 1 capsule (100 mg total) by mouth 2 (two) times daily for 10 days.  05/21/22 05/31/22 Yes Trevor Iha, FNP  nystatin-triamcinolone (MYCOLOG II) cream Apply topically/sparingly to affected area of the right lower abdomen twice daily, prn 05/21/22  Yes Trevor Iha, FNP  cholecalciferol (VITAMIN D3) 25 MCG (1000 UT) tablet Take 1,000 Units by mouth daily.    [provider]  cyclobenzaprine (FLEXERIL) 10 MG tablet Take by mouth. 08/28/21   [provider]  fluticasone (FLONASE) 50 MCG/ACT nasal spray Place into the nose.    [provider]  gabapentin (NEURONTIN) 100 MG capsule Take 100 mg by mouth 3 (three) times daily.    [provider]  ibuprofen (ADVIL,MOTRIN) 400 MG tablet Take 400 mg by mouth every 6 (six) hours as needed for pain.    [provider]  levonorgestrel-ethinyl estradiol (ALESSE) 0.1-20 MG-MCG tablet Take 1 tablet by mouth daily. 03/06/22   Genia Del, MD  Multiple Vitamin (MULTIVITAMIN) tablet Take 1 tablet by mouth daily.    [provider]  solifenacin (VESICARE) 5 MG tablet Take 5 mg by mouth daily. 12/04/21   [provider]  sulfamethoxazole-trimethoprim (BACTRIM) 400-80 MG tablet Take 1 tablet by mouth as needed. Just before or after intercourse Patient not taking: Reported on 05/21/2022 12/20/21   Seymour Bars,  [place], MD    Family History Family History  Problem Relation Age of Onset   Cancer Maternal Uncle        throat   Cancer Cousin        stomach   Diabetes Mother    Hypertension Mother    Heart disease Mother     Social History Social History   Tobacco Use   Smoking status: Never   Smokeless tobacco: Never  Vaping Use   Vaping Use: Never used  Substance Use Topics   Alcohol use: No    Alcohol/week: 0.0 standard drinks of alcohol   Drug use: No     Allergies   Patient has no known allergies.   Review of Systems Review of Systems  Skin:  Positive for rash.     Physical Exam Triage Vital Signs ED Triage Vitals  Enc Vitals Group     BP  05/21/22 0819 (!) 142/85     Pulse Rate 05/21/22 0819 85     Resp 05/21/22 0819 14     Temp 05/21/22 0819 98.2 F (36.8 C)     Temp Source 05/21/22 0819 Oral     SpO2 05/21/22 0819 100 %     Weight --      Height --      Head Circumference --      Peak Flow --      Pain Score 05/21/22 0821 0     Pain Loc --      Pain Edu? --      Excl. in GC? --    No data found.  Updated Vital Signs BP (!) 142/85 (BP Location: Right Arm)   Pulse 85   Temp 98.2 F (36.8 C) (Oral)   Resp 14   SpO2 100%    Physical Exam Vitals and nursing note reviewed.  Constitutional:      Appearance: Normal appearance. She is obese.  HENT:     Head: Normocephalic and atraumatic.     Mouth/Throat:     Mouth: Mucous membranes are moist.     Pharynx: Oropharynx is clear.  Eyes:     Extraocular Movements: Extraocular movements intact.     Conjunctiva/sclera: Conjunctivae normal.     Pupils: Pupils are equal, round, and reactive to light.  Cardiovascular:     Rate and Rhythm: Normal rate and regular rhythm.     Pulses: Normal pulses.     Heart sounds: Normal heart sounds.  Pulmonary:     Effort: Pulmonary effort is normal.     Breath sounds: Normal breath sounds. No wheezing or rales.  Musculoskeletal:     Cervical back: Normal range of motion and neck supple.  Skin:    General: Skin is warm and dry.     Comments: Right lower quadrant (rectangular shaped area of erythema):~ 15 cm x 8 cm erythematous, indurated, macerated, with delicate satellite papulopustules noted-see image below  Neurological:     General: No focal deficit present.     Mental Status: She is alert and oriented to person, place, and time.       UC Treatments / Results  Labs (all labs ordered are listed, but only abnormal results are displayed) Labs Reviewed - No data to display  EKG   Radiology No results found.  Procedures Procedures (including critical care time)  Medications Ordered in UC Medications - No  data to display  Initial Impression / Assessment and Plan / UC Course  I have reviewed the  triage vital signs and the nursing notes.  Pertinent labs & imaging results that were available during my care of the patient were reviewed by me and considered in my medical decision making (see chart for details).     MDM: 1.  Candidal intertrigo-Rx'd Mycolog-II; 2.  Cellulitis of skin-Rx'd Doxycycline. Advised patient to take medication as directed with food to completion.  Advised patient to apply Mycolog-II topically/sparingly twice daily to affected area of right lower abdomen for the next 10 days.  Encouraged patient to increase daily water intake to 64 ounces per day while taking these medications.  Advised patient if symptoms worsen and/or unresolved please follow-up with PCP for dermatology evaluation.  Patient discharged home, hemodynamically stable. Final Clinical Impressions(s) / UC Diagnoses   Final diagnoses:  Candidal intertrigo  Cellulitis of skin     Discharge Instructions      Advised patient to take medication as directed with food to completion.  Advised patient to apply Mycolog-II topically/sparingly twice daily to affected area of right lower abdomen for the next 10 days.  Encouraged patient to increase daily water intake to 64 ounces per day while taking these medications.  Advised patient if symptoms worsen and/or unresolved please follow-up with PCP for dermatology evaluation.     ED Prescriptions     Medication Sig Dispense Auth. Provider   doxycycline (VIBRAMYCIN) 100 MG capsule Take 1 capsule (100 mg total) by mouth 2 (two) times daily for 10 days. 20 capsule Eliezer Lofts, FNP   nystatin-triamcinolone (MYCOLOG II) cream Apply topically/sparingly to affected area of the right lower abdomen twice daily, prn 60 g Eliezer Lofts, FNP      PDMP not reviewed this encounter.   Eliezer Lofts, [place] 05/21/22 [id number]

## 2022-05-21 NOTE — Discharge Instructions (Addendum)
Advised patient to take medication as directed with food to completion.  Advised patient to apply Mycolog-II topically/sparingly twice daily to affected area of right lower abdomen for the next 10 days.  Encouraged patient to increase daily water intake to 64 ounces per day while taking these medications.  Advised patient if symptoms worsen and/or unresolved please follow-up with PCP for dermatology evaluation.

## 2022-06-03 DIAGNOSIS — N3946 Mixed incontinence: Secondary | ICD-10-CM | POA: Insufficient documentation

## 2023-01-08 LAB — HM MAMMOGRAPHY

## 2023-03-03 ENCOUNTER — Ambulatory Visit: Payer: 59 | Admitting: Obstetrics & Gynecology

## 2023-03-10 ENCOUNTER — Encounter: Payer: Self-pay | Admitting: Obstetrics & Gynecology

## 2023-03-10 ENCOUNTER — Other Ambulatory Visit (HOSPITAL_COMMUNITY)
Admission: RE | Admit: 2023-03-10 | Discharge: 2023-03-10 | Disposition: A | Discharge status: Home / Self Care | Payer: 59 | Source: Ambulatory Visit | Attending: Obstetrics & Gynecology | Admitting: Obstetrics & Gynecology

## 2023-03-10 ENCOUNTER — Ambulatory Visit (INDEPENDENT_AMBULATORY_CARE_PROVIDER_SITE_OTHER): Payer: 59 | Admitting: Obstetrics & Gynecology

## 2023-03-10 VITALS — BP 122/78 | HR 86 | Ht 59.0 in | Wt 187.0 lb

## 2023-03-10 DIAGNOSIS — Z01419 Encounter for gynecological examination (general) (routine) without abnormal findings: Secondary | ICD-10-CM

## 2023-03-10 DIAGNOSIS — B3731 Acute candidiasis of vulva and vagina: Secondary | ICD-10-CM

## 2023-03-10 DIAGNOSIS — Z3041 Encounter for surveillance of contraceptive pills: Secondary | ICD-10-CM | POA: Diagnosis not present

## 2023-03-10 MED ORDER — FLUCONAZOLE 150 MG PO TABS: 150.0000 mg | ORAL_TABLET | ORAL | 0 refills | Status: AC

## 2023-03-10 MED ORDER — NORETHINDRONE 0.35 MG PO TABS: 1.0000 | ORAL_TABLET | Freq: Every day | ORAL | 4 refills | Status: DC

## 2023-03-10 NOTE — Progress Notes (Signed)
Lupie Nhem 1971-08-04 [id number]   History:    52 y.o. G2P2L2 Married   RP:  Established patient presenting for annual gyn exam    HPI: Well on Aviane BCPs.  No breakthrough bleeding.  No pelvic pain. Occasional hot flushes.  Vaginal itching currently. Pap Neg 09/2020.  No previous abnormal Pap.  Pap reflex today.  No pain with intercourse.  Followed by Urologist for tendency for postcoital cystitis on prophylaxis and treatment for urinary urgency.  No pain with urination and no blood in urine.  No fever.  Bowel movements normal.  Breasts normal.  Mammo Neg 01/2023.  Body mass index 37.77.  Continue low carb/calorie diet.  Regular physical activity.  Health labs with [facility].  COLONOSCOPY: 2022    Past medical history,surgical history, family history and social history were all reviewed and documented in the EPIC chart.  Gynecologic History Patient's last menstrual period was 02/25/2023.  Obstetric History OB History  Gravida Para Term Preterm AB Living  2 2 2     2   SAB IAB Ectopic Multiple Live Births          2    # Outcome Date GA Lbr Len/2nd Weight Sex Delivery Anes PTL Lv  2 Term     F Vag-Spont  N LIV  1 Term     M Vag-Spont  N LIV     ROS: A ROS was performed and pertinent positives and negatives are included in the history. GENERAL: No fevers or chills. HEENT: No change in vision, no earache, sore throat or sinus congestion. NECK: No pain or stiffness. CARDIOVASCULAR: No chest pain or pressure. No palpitations. PULMONARY: No shortness of breath, cough or wheeze. GASTROINTESTINAL: No abdominal pain, nausea, vomiting or diarrhea, melena or bright red blood per rectum. GENITOURINARY: No urinary frequency, urgency, hesitancy or dysuria. MUSCULOSKELETAL: No joint or muscle pain, no back pain, no recent trauma. DERMATOLOGIC: No rash, no itching, no lesions. ENDOCRINE: No polyuria, polydipsia, no heat or cold intolerance. No recent change in weight. HEMATOLOGICAL: No anemia or easy  bruising or bleeding. NEUROLOGIC: No headache, seizures, numbness, tingling or weakness. PSYCHIATRIC: No depression, no loss of interest in normal activity or change in sleep pattern.     Exam:   BP 122/78 (BP Location: Right Arm, Patient Position: Sitting, Cuff Size: Normal)   Pulse 86   Ht 4\' 11"  (1.499 m)   Wt 187 lb (84.8 kg)   LMP 02/25/2023   SpO2 98%   BMI 37.77 kg/m   Body mass index is 37.77 kg/m.  General appearance : Well developed well nourished female. No acute distress HEENT: Eyes: no retinal hemorrhage or exudates,  Neck supple, trachea midline, no carotid bruits, no thyroidmegaly Lungs: Clear to auscultation, no rhonchi or wheezes, or rib retractions  Heart: Regular rate and rhythm, no murmurs or gallops Breast:Examined in sitting and supine position were symmetrical in appearance, no palpable masses or tenderness,  no skin retraction, no nipple inversion, no nipple discharge, no skin discoloration, no axillary or supraclavicular lymphadenopathy Abdomen: no palpable masses or tenderness, no rebound or guarding Extremities: no edema or skin discoloration or tenderness  Pelvic: Vulva: Normal             Vagina: No gross lesions.  Yeasty discharge.  Cervix: No gross lesions.  Pap reflex done.  Uterus  AV, normal size, shape and consistency, non-tender and mobile  Adnexa  Without masses or tenderness  Anus: Normal   Assessment/Plan:  52 y.o.  female for annual exam   1. Encounter for routine gynecological examination with Papanicolaou smear of cervix Well on Aviane BCPs.  No breakthrough bleeding.  No pelvic pain. Occasional hot flushes.  Vaginal itching currently. Pap Neg 09/2020.  No previous abnormal Pap.  Pap reflex today.  No pain with intercourse.  Followed by Urologist for tendency for postcoital cystitis on prophylaxis and treatment for urinary urgency.  No pain with urination and no blood in urine.  No fever.  Bowel movements normal.  Breasts normal.  Mammo Neg  01/2023.  Body mass index 37.77.  Continue low carb/calorie diet.  Regular physical activity.  Health labs with [facility].  COLONOSCOPY: 2022 - Cytology - PAP( [place])  2. Encounter for surveillance of contraceptive pills Well on Aviane BCPs.  No breakthrough bleeding.  No pelvic pain. Occasional hot flushes. Given age 78, probably perimenopausal, decision to switch to a Progestin only BCP.  Risks/Benefits, usage reviewed.  Prescription sent to pharmacy.  3. Yeast vaginitis Will treat with Fluconazole.  Usage reviewed, prescription sent to pharmacy.  Other orders - valACYclovir (VALTREX) 1000 MG tablet; Take by mouth. - norethindrone (MICRONOR) 0.35 MG tablet; Take 1 tablet (0.35 mg total) by mouth daily. - fluconazole (DIFLUCAN) 150 MG tablet; Take 1 tablet (150 mg total) by mouth every other day for 3 doses.   Genia Del MD, 2:43 PM

## 2023-03-13 LAB — CYTOLOGY - PAP
Adequacy: ABSENT
Comment: NEGATIVE
Diagnosis: NEGATIVE
High risk HPV: NEGATIVE

## 2023-08-18 ENCOUNTER — Encounter: Payer: Self-pay | Admitting: Physician Assistant

## 2023-08-18 DIAGNOSIS — E785 Hyperlipidemia, unspecified: Secondary | ICD-10-CM | POA: Insufficient documentation

## 2023-08-18 DIAGNOSIS — R739 Hyperglycemia, unspecified: Secondary | ICD-10-CM | POA: Insufficient documentation

## 2023-08-19 ENCOUNTER — Ambulatory Visit: Payer: 59 | Admitting: Internal Medicine

## 2023-08-19 ENCOUNTER — Encounter: Payer: Self-pay | Admitting: Physician Assistant

## 2023-08-19 ENCOUNTER — Encounter: Payer: Self-pay | Admitting: Internal Medicine

## 2023-08-19 VITALS — BP 122/78 | HR 76 | Temp 97.8°F | Resp 16 | Ht 59.0 in | Wt 190.5 lb

## 2023-08-19 DIAGNOSIS — R238 Other skin changes: Secondary | ICD-10-CM | POA: Diagnosis not present

## 2023-08-19 DIAGNOSIS — Q839 Congenital malformation of breast, unspecified: Secondary | ICD-10-CM

## 2023-08-19 DIAGNOSIS — N390 Urinary tract infection, site not specified: Secondary | ICD-10-CM | POA: Diagnosis not present

## 2023-08-19 DIAGNOSIS — N912 Amenorrhea, unspecified: Secondary | ICD-10-CM

## 2023-08-19 DIAGNOSIS — Z09 Encounter for follow-up examination after completed treatment for conditions other than malignant neoplasm: Secondary | ICD-10-CM | POA: Insufficient documentation

## 2023-08-19 LAB — POCT URINE PREGNANCY: Preg Test, Ur: NEGATIVE

## 2023-08-19 NOTE — Assessment & Plan Note (Signed)
New pt, to get established, several issues discussed UTI:  Dx12/13/2024, at another office. Urine culture showed Enterococcus Was Rx Bactrim, symptoms continue including urinary urgency, frequency, some dysuria and mild lower abdominal discomfort.  She was switched to ciprofloxacin which she will start soon.  (Sensitive to cipro per urine culture). UPT today (-) Recommend good hydration, if she is not gradually better needs further evaluation ASAP to rule out other etiologies of her symptoms. Appendectomy scar: likely irritated by moist, has  abdominal folds at the area, recommend to keep the area clean and dry Amenorrhea: For 3 months after she change her birth control pills. UPT neg, rec to discuss w/ gyn Nipple sensitivity swelling: Exam is benign, no discharge, last MMG 01/08/2023, for now recommend observation.  If problem continue recommend to let me know. Preventive care: Had a flu shot C-scope 06/12/2021, WNL, next 10 years, Dr. Yevonne Pax RTC CPX 3 months ( plans to come back in 6 months, last CPX was in June 2024)

## 2023-08-19 NOTE — Patient Instructions (Addendum)
Drink plenty of fluids  Start ciprofloxacin as soon as possible.  If your urinary symptoms continue after ciprofloxacin let us know  If you have fever, chills, the lower abdominal pain continue: Contact your gynecologist  Nipples: For now observation, call if symptoms are not gradually improving.  Schedule a physical exam in 3 months

## 2023-08-19 NOTE — Progress Notes (Signed)
Subjective:    Patient ID: Destiny Rios, female    DOB: May 03, 1971, 52 y.o.   MRN: [id number]  DOS:  08/19/2023 Type of visit - description: New patient, referred by her husband  To be established . Recently was diagnosed with a UTI. Her main symptoms were urinary urgency and frequency.  She also have some mild, small amount of white vaginal discharge and suprapubic pressure. Went to urgent care.  Results reviewed, UA consistent with UTI, urine culture came back positive..   Also has a appendectomy scar that gets irritated from time to time.  Also in the last few days her nipples seem to be irritated and slightly swollen.  Denies any discharge or bleeding.  Started a new birth control pill 3 months ago, no menses since.   Review of Systems See above   Past Medical History:  Diagnosis Date   GERD (gastroesophageal reflux disease)    Hyperglycemia    Hyperlipidemia    IBS (irritable bowel syndrome)    Migraine    Mixed incontinence    Vitamin D deficiency     Past Surgical History:  Procedure Laterality Date   APPENDECTOMY  1982   Social History   Socioeconomic History   Marital status: Married    Spouse name: Not on file   Number of children: 2   Years of education: Not on file   Highest education level: Not on file  Occupational History   Not on file  Tobacco Use   Smoking status: Never   Smokeless tobacco: Never  Vaping Use   Vaping status: Never Used  Substance and Sexual Activity   Alcohol use: No    Alcohol/week: 0.0 standard drinks of alcohol   Drug use: No   Sexual activity: Yes    Partners: Male    Birth control/protection: Pill    Comment: intercourse age 15, sexual partners less than 5  Other Topics Concern   Not on file  Social History Narrative   Household: pt, husband, 2 children   G2P2   Social Drivers of Corporate investment banker Strain: Low Risk  (01/01/2023)   Received from [facility], [facility]   Overall Financial  Resource Strain (CARDIA)    Difficulty of Paying Living Expenses: Not hard at all  Food Insecurity: No Food Insecurity (01/01/2023)   Received from [facility], [facility]   Hunger Vital Sign    Worried About Running Out of Food in the Last Year: Never true    Ran Out of Food in the Last Year: Never true  Transportation Needs: No Transportation Needs (01/01/2023)   Received from [facility], [facility]   PRAPARE - Transportation    Lack of Transportation (Medical): No    Lack of Transportation (Non-Medical): No  Physical Activity: Not on file  Stress: Not on file  Social Connections: Unknown (01/11/2022)   Received from [facility], [facility]   Social Network    Social Network: Not on file  Intimate Partner Violence: Unknown (12/04/2021)   Received from [facility], [facility]   HITS    Physically Hurt: Not on file    Insult or Talk Down To: Not on file    Threaten Physical Harm: Not on file    Scream or Curse: Not on file   Family History  Problem Relation Age of Onset   Diabetes Mother    Hypertension Mother    Heart disease Mother    Cancer Maternal Uncle  throat   Cancer Cousin        stomach   Colon cancer Neg Hx    Breast cancer Neg Hx     Current Outpatient Medications  Medication Instructions   ciprofloxacin (CIPRO) 500 mg, 2 times daily   fluconazole (DIFLUCAN) 150 mg,  Once   gabapentin (NEURONTIN) 100 mg, 3 times daily   norethindrone (MICRONOR) 0.35 MG tablet 1 tablet, Daily   valACYclovir (VALTREX) 1,000 mg, Oral, Daily PRN   Vitamin D (Ergocalciferol) (DRISDOL) 50,000 Units, Weekly       Objective:   Physical Exam BP 122/78   Pulse 76   Temp 97.8 F (36.6 C) (Oral)   Resp 16   Ht 4\' 11"  (1.499 m)   Wt 190 lb 8 oz (86.4 kg)   SpO2 98%   BMI 38.48 kg/m  General:   Well developed, NAD, BMI noted.  HEENT:  Normocephalic . Face symmetric, atraumatic Lungs:  CTA B Normal respiratory effort, no intercostal retractions, no  accessory muscle use. Breast: No dominant mass, skin normal, nipples with no discharge.   Heart: RRR,  no murmur.  Abdomen:  Not distended, soft, slightly tender at lower abdomen throughout, no mass, no rebound.  Well-healed appendectomy scar on the right lower quadrant.  Minimal erythema noted, no dark lesions, no openings. Skin: Not pale. Not jaundice Lower extremities: no pretibial edema bilaterally  Neurologic:  alert & oriented X3.  Speech normal, gait appropriate for age and unassisted Psych--  Cognition and judgment appear intact.  Cooperative with normal attention span and concentration.  Behavior appropriate. No anxious or depressed appearing.     Assessment    ASSESSMENT Hyperglycemia Vit d def H/o migraines  CTS (gaba prn) Herpes: Valtrex prn  PLAN   New pt, to get established, several issues discussed UTI:  Dx12/13/2024, at another office. Urine culture showed Enterococcus Was Rx Bactrim, symptoms continue including urinary urgency, frequency, some dysuria and mild lower abdominal discomfort.  She was switched to ciprofloxacin which she will start soon.  (Sensitive to cipro per urine culture). UPT today (-) Recommend good hydration, if she is not gradually better needs further evaluation ASAP to rule out other etiologies of her symptoms. Appendectomy scar: likely irritated by moist, has  abdominal folds at the area, recommend to keep the area clean and dry Amenorrhea: For 3 months after she change her birth control pills. UPT neg, rec to discuss w/ gyn Nipple sensitivity swelling: Exam is benign, no discharge, last MMG 01/08/2023, for now recommend observation.  If problem continue recommend to let me know. Preventive care: Had a flu shot C-scope 06/12/2021, WNL, next 10 years, Dr. Yevonne Pax RTC CPX 3 months ( plans to come back in 6 months, last CPX was in June 2024)

## 2024-01-21 ENCOUNTER — Telehealth: Payer: Self-pay

## 2024-01-21 ENCOUNTER — Encounter: Payer: Self-pay | Admitting: Internal Medicine

## 2024-01-21 ENCOUNTER — Ambulatory Visit: Admitting: Internal Medicine

## 2024-01-21 VITALS — BP 108/72 | HR 91 | Temp 98.2°F | Resp 16 | Ht 59.0 in | Wt 188.1 lb

## 2024-01-21 DIAGNOSIS — J020 Streptococcal pharyngitis: Secondary | ICD-10-CM

## 2024-01-21 LAB — POCT RAPID STREP A (OFFICE): Rapid Strep A Screen: POSITIVE — AB

## 2024-01-21 MED ORDER — AMOXICILLIN 875 MG PO TABS: 875.0000 mg | ORAL_TABLET | Freq: Two times a day (BID) | ORAL | 0 refills | Status: AC

## 2024-01-21 MED ORDER — VALACYCLOVIR HCL 1 G PO TABS
ORAL_TABLET | ORAL | 0 refills | Status: AC
Start: 2024-01-21 — End: ?

## 2024-01-21 NOTE — Telephone Encounter (Signed)
 LMOM informing Pt that her strep test did come back positive, PCP has sent in an abx to [facility] for her.

## 2024-01-21 NOTE — Progress Notes (Signed)
   Subjective:    Patient ID: Destiny Rios, female    DOB: 1971/08/16, 53 y.o.   MRN: [id number]  DOS:  01/21/2024 Type of visit - description: Acute  Sore throat for 5 days. Increase with swallowing. No fever or chills.  No cough.  No acid reflux. No sick contacts.  Also, has on and off cold sores, last episode started about a week ago (at this point she has no oral ulcers)  Review of Systems See above   Past Medical History:  Diagnosis Date   GERD (gastroesophageal reflux disease)    Hyperglycemia    Hyperlipidemia    IBS (irritable bowel syndrome)    Migraine    Mixed incontinence    Vitamin D  deficiency     Past Surgical History:  Procedure Laterality Date   APPENDECTOMY  1982    Current Outpatient Medications  Medication Instructions   fluconazole  (DIFLUCAN ) 150 mg,  Once   gabapentin (NEURONTIN) 100 mg, 3 times daily   norethindrone  (MICRONOR ) 0.35 MG tablet 1 tablet, Daily   valACYclovir  (VALTREX ) 1,000 mg, Daily PRN   Vitamin D  (Ergocalciferol ) (DRISDOL ) 50,000 Units, Weekly       Objective:   Physical Exam BP 108/72   Pulse 91   Temp 98.2 F (36.8 C) (Oral)   Resp 16   Ht 4\' 11"  (1.499 m)   Wt 188 lb 2 oz (85.3 kg)   SpO2 99%   BMI 38.00 kg/m  General:   Well developed, NAD, BMI noted. HEENT:  Normocephalic . Face symmetric, atraumatic Throat: Symmetric, mild redness without discharge.  No ulcers.  Tonsils normal-sized and symmetric. TMs normal Skin: Has a essentially healed cold sore at the lower lip Neurologic:  alert & oriented X3.  Speech normal, gait appropriate for age and unassisted Psych--  Cognition and judgment appear intact.  Cooperative with normal attention span and concentration.  Behavior appropriate. No anxious or depressed appearing.      Assessment    ASSESSMENT Hyperglycemia Vit d def H/o migraines  CTS (gaba prn) Herpes: Valtrex  prn  PLAN   Pharyngitis: Sore throat with minimal redness on exam, otherwise  ROS negative and exam benign.   Will send rapid strep test and throat culture ( if needed). Consider antibiotics depending on results, otherwise Tylenol , observation and call if not better. Addendum: Rapid strep test +.  Recommend amoxicillin  875 mg twice daily for 2 days.  Rx sent.  Cancel culture.  Patient was notified by my nurse, I also talked to her, she is aware he needs antibiotics, request Diflucan .  Will do. Cold sores: Rx for Valtrex  sent.

## 2024-01-21 NOTE — Patient Instructions (Signed)
 For sore throat:  Were sending test for strep.  If they are positive, we will contact you and start antibiotics.  Otherwise take Tylenol  and let me know if you are not gradually improving.  For cold sores: Take Valtrex  1000 mg 1 tablet twice daily for ONE  day for each episode

## 2024-01-22 ENCOUNTER — Encounter: Payer: Self-pay | Admitting: Internal Medicine

## 2024-01-22 MED ORDER — FLUCONAZOLE 150 MG PO TABS: 150.0000 mg | ORAL_TABLET | Freq: Every day | ORAL | 0 refills | Status: DC

## 2024-01-22 NOTE — Assessment & Plan Note (Signed)
 Pharyngitis: Sore throat with minimal redness on exam, otherwise ROS negative and exam benign.   Will send rapid strep test and throat culture ( if needed). Consider antibiotics depending on results, otherwise Tylenol , observation and call if not better. Addendum: Rapid strep test +.  Recommend amoxicillin  875 mg twice daily for 2 days.  Rx sent.  Cancel culture.  Patient was notified by my nurse, I also talked to her, she is aware he needs antibiotics, request Diflucan .  Will do. Cold sores: Rx for Valtrex  sent.

## 2024-02-20 ENCOUNTER — Ambulatory Visit (INDEPENDENT_AMBULATORY_CARE_PROVIDER_SITE_OTHER): Payer: 59 | Admitting: Internal Medicine

## 2024-02-20 ENCOUNTER — Encounter: Payer: Self-pay | Admitting: Internal Medicine

## 2024-02-20 VITALS — BP 122/84 | HR 88 | Temp 98.1°F | Resp 16 | Ht 59.0 in | Wt 187.4 lb

## 2024-02-20 DIAGNOSIS — E785 Hyperlipidemia, unspecified: Secondary | ICD-10-CM | POA: Diagnosis not present

## 2024-02-20 DIAGNOSIS — E559 Vitamin D deficiency, unspecified: Secondary | ICD-10-CM

## 2024-02-20 DIAGNOSIS — D229 Melanocytic nevi, unspecified: Secondary | ICD-10-CM

## 2024-02-20 DIAGNOSIS — R739 Hyperglycemia, unspecified: Secondary | ICD-10-CM

## 2024-02-20 DIAGNOSIS — E66812 Obesity, class 2: Secondary | ICD-10-CM

## 2024-02-20 DIAGNOSIS — Z0001 Encounter for general adult medical examination with abnormal findings: Secondary | ICD-10-CM

## 2024-02-20 DIAGNOSIS — Z0000 Encounter for general adult medical examination without abnormal findings: Secondary | ICD-10-CM

## 2024-02-20 DIAGNOSIS — Z23 Encounter for immunization: Secondary | ICD-10-CM

## 2024-02-20 DIAGNOSIS — Z6837 Body mass index (BMI) 37.0-37.9, adult: Secondary | ICD-10-CM

## 2024-02-20 DIAGNOSIS — E6609 Other obesity due to excess calories: Secondary | ICD-10-CM

## 2024-02-20 LAB — COMPREHENSIVE METABOLIC PANEL WITH GFR
ALT: 33 U/L (ref 0–35)
AST: 23 U/L (ref 0–37)
Albumin: 4.5 g/dL (ref 3.5–5.2)
Alkaline Phosphatase: 95 U/L (ref 39–117)
BUN: 22 mg/dL (ref 6–23)
CO2: 28 meq/L (ref 19–32)
Calcium: 10 mg/dL (ref 8.4–10.5)
Chloride: 104 meq/L (ref 96–112)
Creatinine, Ser: 0.77 mg/dL (ref 0.40–1.20)
GFR: 88.63 mL/min (ref >60.00)
Glucose, Bld: 98 mg/dL (ref 70–99)
Potassium: 4 meq/L (ref 3.5–5.1)
Sodium: 139 meq/L (ref 135–145)
Total Bilirubin: 0.6 mg/dL (ref 0.2–1.2)
Total Protein: 7.3 g/dL (ref 6.0–8.3)

## 2024-02-20 LAB — CBC WITH DIFFERENTIAL/PLATELET
Basophils Absolute: 0 10*3/uL (ref 0.0–0.1)
Basophils Relative: 0.4 % (ref 0.0–3.0)
Eosinophils Absolute: 0.1 10*3/uL (ref 0.0–0.7)
Eosinophils Relative: 1.2 % (ref 0.0–5.0)
HCT: 42.8 % (ref 36.0–46.0)
Hemoglobin: 14.3 g/dL (ref 12.0–15.0)
Lymphocytes Relative: 25 % (ref 12.0–46.0)
Lymphs Abs: 2.4 10*3/uL (ref 0.7–4.0)
MCHC: 33.4 g/dL (ref 30.0–36.0)
MCV: 94.6 fl (ref 78.0–100.0)
Monocytes Absolute: 0.7 10*3/uL (ref 0.1–1.0)
Monocytes Relative: 7.6 % (ref 3.0–12.0)
Neutro Abs: 6.4 10*3/uL (ref 1.4–7.7)
Neutrophils Relative %: 65.8 % (ref 43.0–77.0)
Platelets: 216 10*3/uL (ref 150.0–400.0)
RBC: 4.52 Mil/uL (ref 3.87–5.11)
RDW: 12.9 % (ref 11.5–15.5)
WBC: 9.7 10*3/uL (ref 4.0–10.5)

## 2024-02-20 LAB — LIPID PANEL
Cholesterol: 162 mg/dL (ref 0–200)
HDL: 50.7 mg/dL (ref >39.00)
LDL Cholesterol: 96 mg/dL (ref 0–99)
NonHDL: 111.26
Total CHOL/HDL Ratio: 3
Triglycerides: 75 mg/dL (ref 0.0–149.0)
VLDL: 15 mg/dL (ref 0.0–40.0)

## 2024-02-20 LAB — TSH: TSH: 0.85 u[IU]/mL (ref 0.35–5.50)

## 2024-02-20 LAB — HEMOGLOBIN A1C: Hgb A1c MFr Bld: 6 % (ref 4.6–6.5)

## 2024-02-20 LAB — VITAMIN D 25 HYDROXY (VIT D DEFICIENCY, FRACTURES): VITD: 38.14 ng/mL (ref 30.00–100.00)

## 2024-02-20 NOTE — Assessment & Plan Note (Signed)
 Here for CPX   Other issues addressed today: Hyperglycemia: Checking labs.  See next Obesity, BMI 37: Has been obese for many years, the last time she tried to tackle the issue was about 2 years ago, lost 5 pounds. We had a long conversation about a healthy diet including portion control, decrease carbohydrates. Recommend to come back in 4 months, GLP-1's?. Vitamin D  deficiency: On supplements.  Checking labs Skin: Has a variety of moles, SKs etc. at the upper back, none seen malignant.  Warning symptoms discussed, recommend the area to be check and if something changes let me know.   RTC 4 months (mostly for obesity management).

## 2024-02-20 NOTE — Patient Instructions (Addendum)
 You got your first shingles vaccine today, will give you the second 1 when you come back  Vaccines are recommended A flu shot every fall A COVID booster if not done in the last 9 months  Diet: Try to reduce portions, calorie counting? Avoid excessive carbohydrates (bread, pasta, tortilla, potato, regular sodas, sweet tea, cake, desserts) Increase fruits, vegetables  GO TO THE LAB :  Get the blood work   Your results will be posted on MyChart with my comments  Next office visit for a checkup in 4 months Please make an appointment before you leave today

## 2024-02-20 NOTE — Progress Notes (Signed)
   Subjective:    Patient ID: Destiny Rios, female    DOB: 12-Feb-1971, 53 y.o.   MRN: [id number]  DOS:  02/20/2024 Type of visit - description: CPX  Here for CPX Feeling well, has some skin lesions on the upper back.  Would like them to be checked  Review of Systems  Other than above, a 14 point review of systems is negative    Past Medical History:  Diagnosis Date   GERD (gastroesophageal reflux disease)    Hyperglycemia    Hyperlipidemia    IBS (irritable bowel syndrome)    Migraine    Mixed incontinence    Vitamin D  deficiency     Past Surgical History:  Procedure Laterality Date   APPENDECTOMY  15   Social History   Social History Narrative   Household: pt, husband, 2 children   G2P2    Current Outpatient Medications  Medication Instructions   fluconazole  (DIFLUCAN ) 150 mg, Oral, Daily   gabapentin (NEURONTIN) 100 mg, 3 times daily   norethindrone  (MICRONOR ) 0.35 MG tablet 1 tablet, Daily   valACYclovir  (VALTREX ) 1000 MG tablet 1 tablet twice a day for 1 day for each episode of cold sores   Vitamin D  (Ergocalciferol ) (DRISDOL ) 50,000 Units, Weekly       Objective:   Physical Exam BP 122/84   Pulse 88   Temp 98.1 F (36.7 C) (Oral)   Resp 16   Ht 4' 11 (1.499 m)   Wt 187 lb 6 oz (85 kg)   SpO2 97%   BMI 37.85 kg/m  General: Well developed, NAD, BMI noted Neck: No  thyromegaly  HEENT:  Normocephalic . Face symmetric, atraumatic Lungs:  CTA B Normal respiratory effort, no intercostal retractions, no accessory muscle use. Heart: RRR,  no murmur.  Abdomen:  Not distended, soft, non-tender. No rebound or rigidity.   Lower extremities: no pretibial edema bilaterally  Skin: Has a variety of skin lesions at the upper back, no are very dark, irregular or irritated. Neurologic:  alert & oriented X3.  Speech normal, gait appropriate for age and unassisted Strength symmetric and appropriate for age.  Psych: Cognition and judgment appear  intact.  Cooperative with normal attention span and concentration.  Behavior appropriate. No anxious or depressed appearing.     Assessment     ASSESSMENT Hyperglycemia Vit d def H/o migraines  CTS (gaba prn) Herpes: Valtrex  prn  PLAN   Here for CPX Tdap 2016 Shingrix No. 1 today, next on RTC  vaccines I recommend:  flu shot every fall, COVID booster from 05/2023. Female care: Pap smear 03/13/2023 ([facility]). MMG 01/08/2023 ([facility]).  Has a schedule a follow-up mammogram already [facility]: C-scope 06/12/2021, WNL, next 10 years, Dr. Danella Dunn  Labs: CMP FLP CBC A1c TSH vitamin D   Other issues addressed today: Hyperglycemia: Checking labs.  See next Obesity, BMI 37: Has been obese for many years, the last time she tried to tackle the issue was about 2 years ago, lost 5 pounds. We had a long conversation about a healthy diet including portion control, decrease carbohydrates. Recommend to come back in 4 months, GLP-1's?. Vitamin D  deficiency: On supplements.  Checking labs Skin: Has a variety of moles, SKs etc. at the upper back, none seen malignant.  Warning symptoms discussed, recommend the area to be check and if something changes let me know.   RTC 4 months (mostly for obesity management).

## 2024-02-20 NOTE — Assessment & Plan Note (Signed)
 Here for CPX Tdap 2016 Shingrix No. 1 today, next on RTC  vaccines I recommend:  flu shot every fall, COVID booster from 05/2023. Female care: Pap smear 03/13/2023 ([facility]). MMG 01/08/2023 ([facility]).  Has a schedule a follow-up mammogram already [facility]: C-scope 06/12/2021, WNL, next 10 years, Dr. Danella Dunn  Labs: CMP FLP CBC A1c TSH vitamin D 

## 2024-02-23 ENCOUNTER — Ambulatory Visit: Payer: Self-pay | Admitting: Internal Medicine

## 2024-04-16 ENCOUNTER — Other Ambulatory Visit: Payer: Self-pay

## 2024-04-16 MED ORDER — NORETHINDRONE 0.35 MG PO TABS: 1.0000 | ORAL_TABLET | Freq: Every day | ORAL | 0 refills | Status: AC

## 2024-04-16 NOTE — Telephone Encounter (Signed)
 Med refill request: Norethindrone   Last AEX: 03/10/23 Next AEX: not scheduled, message sent to fd  Last MMG (if hormonal med)  Refill authorized: last rx 03/10/23 #84 with 4 refills. Please approve or deny

## 2024-04-21 NOTE — Telephone Encounter (Signed)
 Received a request for the [facility]indrone  from [facility], LM asking pt to call back to let us  know if she needs this script sent there as well.

## 2024-04-26 ENCOUNTER — Encounter: Payer: Self-pay | Admitting: Family Medicine

## 2024-04-26 ENCOUNTER — Ambulatory Visit: Admitting: Family Medicine

## 2024-04-26 VITALS — BP 118/78 | HR 71 | Temp 98.0°F | Resp 16 | Ht 59.0 in | Wt 189.4 lb

## 2024-04-26 DIAGNOSIS — H6992 Unspecified Eustachian tube disorder, left ear: Secondary | ICD-10-CM | POA: Diagnosis not present

## 2024-04-26 MED ORDER — FLUTICASONE PROPIONATE 50 MCG/ACT NA SUSP
2.0000 | Freq: Every day | NASAL | 6 refills | Status: AC
Start: 2024-04-26 — End: ?

## 2024-04-26 NOTE — Patient Instructions (Addendum)
 OK to use Debrox (peroxide) in the ear to loosen up wax. Also recommend using a bulb syringe (for removing boogers from baby's noses) to flush through warm water and vinegar (3-4:1 ratio). An alternative, though more expensive, is an elephant ear washer wax removal kit. Do not use Q-tips as this can impact wax further.  Consider a nasal steroid like Flonase /Nasonex/Rhinocort for the fullness in your ears.   Let us  know if you need anything.

## 2024-04-26 NOTE — Progress Notes (Signed)
 Chief Complaint  Patient presents with   Foreign Body in Ear    Q-Tip Cotton Ear    Subjective: Patient is a 53 y.o. female here for FB in ear.  L ear was cleaning and got cotton stuck inside. Did not try anything at home. Slight decrease in hearing. No pain or drainage.   Past Medical History:  Diagnosis Date   GERD (gastroesophageal reflux disease)    Hyperglycemia    Hyperlipidemia    IBS (irritable bowel syndrome)    Migraine    Mixed incontinence    Vitamin D  deficiency     Objective: BP 118/78 (BP Location: Left Arm, Patient Position: Sitting)   Pulse 71   Temp 98 F (36.7 C) (Oral)   Resp 16   Ht 4' 11 (1.499 m)   Wt 189 lb 6.4 oz (85.9 kg)   SpO2 100%   BMI 38.25 kg/m  General: Awake, appears stated age Ears: Canals patent bilaterally, no otorrhea, TMs negative bilaterally Lungs: No accessory muscle use Psych: Age appropriate judgment and insight, normal affect and mood  Assessment and Plan: Dysfunction of left eustachian tube  No foreign body noted today.  She asked about the hearing situation which is likely eustachian tube dysfunction as she notes ragweed/mold does affect her allergies.  Flonase  sent in.  Can get nasal steroids over-the-counter.  I also gave her home wax removal ideas not involving Q-tips. The patient voiced understanding and agreement to the plan.  Mabel [place], DO 04/26/24  8:47 AM

## 2024-05-04 ENCOUNTER — Other Ambulatory Visit: Payer: Self-pay

## 2024-05-04 NOTE — Telephone Encounter (Addendum)
 Med refill request: Norethindrone  (micronor ) 0.35 mg Last AEX: 03/10/23 Next AEX: not scheduled Last MMG (if hormonal med): 2015 Start: 04/16/24 - #84 with 0 refills. Please approve or deny

## 2024-06-22 ENCOUNTER — Ambulatory Visit: Admitting: Internal Medicine

## 2024-07-11 ENCOUNTER — Other Ambulatory Visit: Payer: Self-pay | Admitting: Radiology

## 2024-07-12 NOTE — Telephone Encounter (Signed)
.  Med refill request: Destiny Rios   Last AEX: 03/10/23 Next AEX: not scheduled, message has been sent to the front for scheduling  Last MMG (if hormonal med) 01/08/23 Refill authorized: Please Advise?    RX has been refused last note to pharmacy was Needs office visit for further refills.

## 2024-08-25 ENCOUNTER — Ambulatory Visit (INDEPENDENT_AMBULATORY_CARE_PROVIDER_SITE_OTHER)

## 2024-08-25 DIAGNOSIS — Z23 Encounter for immunization: Secondary | ICD-10-CM | POA: Diagnosis not present

## 2025-03-08 ENCOUNTER — Encounter: Admitting: Internal Medicine
